# Patient Record
Sex: Female | Born: 1955
Health system: Southern US, Community
[De-identification: ages and names within clinical notes are randomized; demographics above are authoritative.]

## PROBLEM LIST (undated history)

## (undated) DIAGNOSIS — E079 Disorder of thyroid, unspecified: Secondary | ICD-10-CM

## (undated) DIAGNOSIS — Z923 Personal history of irradiation: Secondary | ICD-10-CM

## (undated) DIAGNOSIS — C50212 Malignant neoplasm of upper-inner quadrant of left female breast: Secondary | ICD-10-CM

## (undated) DIAGNOSIS — T8859XA Other complications of anesthesia, initial encounter: Secondary | ICD-10-CM

## (undated) DIAGNOSIS — G43909 Migraine, unspecified, not intractable, without status migrainosus: Secondary | ICD-10-CM

## (undated) DIAGNOSIS — C50919 Malignant neoplasm of unspecified site of unspecified female breast: Secondary | ICD-10-CM

## (undated) DIAGNOSIS — K219 Gastro-esophageal reflux disease without esophagitis: Secondary | ICD-10-CM

## (undated) HISTORY — DX: Disorder of thyroid, unspecified: E07.9

## (undated) HISTORY — DX: Gastro-esophageal reflux disease without esophagitis: K21.9

## (undated) HISTORY — PX: ABDOMINAL HYSTERECTOMY: SHX81

## (undated) HISTORY — DX: Migraine, unspecified, not intractable, without status migrainosus: G43.909

## (undated) HISTORY — DX: Malignant neoplasm of unspecified site of unspecified female breast: C50.919

## (undated) HISTORY — DX: Malignant neoplasm of upper-inner quadrant of left female breast: C50.212

---
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
## 2002-11-14 HISTORY — PX: BLADDER SUSPENSION: SHX72

## 2008-01-02 ENCOUNTER — Ambulatory Visit: Payer: Self-pay | Admitting: Otolaryngology

## 2009-11-14 HISTORY — PX: COLONOSCOPY: SHX174

## 2011-06-06 ENCOUNTER — Ambulatory Visit: Payer: Self-pay | Admitting: Otolaryngology

## 2014-11-14 HISTORY — PX: BREAST LUMPECTOMY: SHX2

## 2014-12-11 ENCOUNTER — Ambulatory Visit: Payer: Self-pay | Admitting: Otolaryngology

## 2014-12-15 DIAGNOSIS — C50212 Malignant neoplasm of upper-inner quadrant of left female breast: Secondary | ICD-10-CM

## 2014-12-15 HISTORY — DX: Malignant neoplasm of upper-inner quadrant of left female breast: C50.212

## 2014-12-15 HISTORY — PX: BREAST BIOPSY: SHX20

## 2014-12-22 ENCOUNTER — Ambulatory Visit: Payer: Self-pay | Admitting: Physician Assistant

## 2014-12-30 ENCOUNTER — Ambulatory Visit: Payer: Self-pay | Admitting: Oncology

## 2014-12-31 ENCOUNTER — Encounter: Payer: Self-pay | Admitting: General Surgery

## 2014-12-31 ENCOUNTER — Other Ambulatory Visit: Payer: No Typology Code available for payment source

## 2014-12-31 ENCOUNTER — Ambulatory Visit (INDEPENDENT_AMBULATORY_CARE_PROVIDER_SITE_OTHER): Payer: No Typology Code available for payment source | Admitting: General Surgery

## 2014-12-31 VITALS — BP 124/72 | HR 74 | Resp 12 | Wt 123.0 lb

## 2014-12-31 DIAGNOSIS — C50911 Malignant neoplasm of unspecified site of right female breast: Secondary | ICD-10-CM | POA: Diagnosis not present

## 2014-12-31 DIAGNOSIS — C50919 Malignant neoplasm of unspecified site of unspecified female breast: Secondary | ICD-10-CM | POA: Insufficient documentation

## 2014-12-31 NOTE — Progress Notes (Addendum)
Patient ID: Amanda Odom, female   DOB: [birth year], 60 y.o.   MRN: [id number]  Chief Complaint  Patient presents with  . Other    breast evaluation    HPI Amanda Odom is a 59 y.o. female who presents for a left breast mass. The patient's most recent mammogram was done on 12/12/14. The patient subsequently had additional imaging and underwent a biopsy performed on the left breast on 12/22/14. She denies feeling any lumps. She gets regular mammograms and does regular self breast checks.  No previous abnormal mammograms. No past biopsies. No history of trauma.\\ She has seen Amanda Odom at the [facility].  HPI  Past Medical History  Diagnosis Date  . GERD (gastroesophageal reflux disease)   . Thyroid disease     Abnormal FNA, thyroidectomy pending with Amanda Sheffield, MD  . Breast cancer of upper-inner quadrant of left female breast February 2016    T1c,N0; ER 90%, PR 50-90%, HER-2/neu not amplified by fish.    Past Surgical History  Procedure Laterality Date  . Abdominal hysterectomy    . Tonsillectomy  1962  . Bladder suspension  2004  . Colonoscopy  2011    History reviewed. No pertinent family history.  Social History History  Substance Use Topics  . Smoking status: Never Smoker   . Smokeless tobacco: Never Used  . Alcohol Use: No    No Known Allergies  Current Outpatient Prescriptions  Medication Sig Dispense Refill  . Ascorbic Acid (VITAMIN C) 1000 MG tablet Take 1,000 mg by mouth daily.    . Coenzyme Q10 (CO Q 10 PO) Take 1 capsule by mouth daily.    Marland Kitchen CORAL CALCIUM PO Take 2 capsules by mouth daily.    . Flaxseed, Linseed, (FLAXSEED OIL) 1000 MG CAPS Take 1 capsule by mouth daily.    . Lactobacillus (ACIDOPHILUS PO) Take 1 tablet by mouth daily.    Marland Kitchen levothyroxine (SYNTHROID, LEVOTHROID) 50 MCG tablet Take 50 mcg by mouth daily before breakfast.    . Misc Natural Products (CURCUMAX PRO PO) Take 1 tablet by mouth daily.    . Multiple Vitamin (MULTIVITAMIN)  tablet Take 1 tablet by mouth daily.     No current facility-administered medications for this visit.    Review of Systems Review of Systems  Constitutional: Negative.   Respiratory: Negative.   Cardiovascular: Negative.     Blood pressure 124/72, pulse 74, resp. rate 12, weight 123 lb (55.792 kg).  Physical Exam Physical Exam  Constitutional: She is oriented to person, place, and time. She appears well-developed and well-nourished.  Neck: Neck supple.  Cardiovascular: Normal rate, regular rhythm and normal heart sounds.   Pulmonary/Chest: Effort normal and breath sounds normal. Right breast exhibits no inverted nipple, no mass, no nipple discharge, no skin change and no tenderness. Left breast exhibits skin change and tenderness. Left breast exhibits no inverted nipple, no mass and no nipple discharge.  Bruising breast with mild tenderness lower outer left breast.  Lymphadenopathy:    She has no cervical adenopathy.    She has no axillary adenopathy.  Neurological: She is alert and oriented to person, place, and time.  Skin: Skin is warm and dry.    Data Reviewed Screening mammograms dated 11/27/2014 completed at [facility] were reviewed. A new asymmetrical area was identified in the lower outer quadrant of the left breast. Subsequent focal spot compression views and ultrasound showed a spiculated irregular mass measuring up to 1.3 cm in diameter on mammogram, 1.0  cm on ultrasound. [id number]  Ultrasound-guided biopsy completed 12/22/2014 showed evidence of invasive mammary carcinoma, grade 1. ER: 90%, PR 50-90%, HER-2/neu 2+, equivocal. Reflex to fish is negative for amplification.  PCP notes as well as laboratory studies completed on 11/13/2014 were reviewed. The laboratory studies showed normal electrolytes, creatinine 0.6, estimated GFR 97, mild elevation of serum Mallie within a 4.9, normal liver function studies, hemoglobin 14.9 with an MCV of 89, white blood cell count  7400 with normal differential.  Ultrasound examination of the left breast was completed to help determine whether nipple sparing can be undertaken. The mass measures 0.55 x 0.69 x 0.74 cm. This is approximately 0.6 cm below the skin and perhaps 1.5 cm from the nipple.  Assessment    Stage I carcinoma the left breast.  Thyroid nodule, possible follicular adenoma versus low-grade malignancy.    Plan    I spoke with her ENT, as the patient was very interested in having both her thyroidectomy in breast cancer surgery at the same time. This would likely push the envelope for surgical intervention up to 4 hours significantly raising her risk of complications. As the thyroid abnormality is a possible malignancy with low potential for progression, and her recent biopsy documents invasive mammary carcinoma,Dr,[name] and I both feel that the patient would be better served by having these procedures completed separately. Options for management were reviewed: 1) mastectomy with or without reconstruction versus 2) breast conservation followed by postoperative radiation therapy.  At this time the patient is interested in breast conservation. I think considering the small lesion and the ample DD cup size, this is very reasonable. The possibility that the nipple/areolar complex could not be preserved was reviewed. Sacrifice with late reconstruction is amenable to the patient.  We'll work at scheduling a convenient date for surgical intervention.      The patient reports appear nausea and vomiting with her last operative for procedure in 2004. A scopolamine patch was not utilized at that time to her recollection.    PCP:  Amanda Odom Ref. Amanda Odom Amanda Odom.  Amanda Odom 01/05/2015, 5:17 PM

## 2014-12-31 NOTE — Patient Instructions (Signed)
The patient is aware to call back for any questions or concerns.  

## 2015-01-05 ENCOUNTER — Encounter: Payer: Self-pay | Admitting: General Surgery

## 2015-01-05 ENCOUNTER — Other Ambulatory Visit: Payer: Self-pay | Admitting: General Surgery

## 2015-01-05 DIAGNOSIS — C50912 Malignant neoplasm of unspecified site of left female breast: Secondary | ICD-10-CM

## 2015-01-07 ENCOUNTER — Telehealth: Payer: Self-pay | Admitting: *Deleted

## 2015-01-07 ENCOUNTER — Other Ambulatory Visit: Payer: Self-pay | Admitting: *Deleted

## 2015-01-07 DIAGNOSIS — C50911 Malignant neoplasm of unspecified site of right female breast: Secondary | ICD-10-CM

## 2015-01-07 MED ORDER — SCOPOLAMINE 1 MG/3DAYS TD PT72: 1.0000 | MEDICATED_PATCH | TRANSDERMAL | Status: DC

## 2015-01-07 NOTE — Telephone Encounter (Signed)
Pt was advised that a prescription for a scopolamine patch was electronically sent to her [facility] in [place] by Rosann Auerbach RN and to use as directed. Advised her to call our office if any questions. She verbalized her understanding.

## 2015-01-12 ENCOUNTER — Ambulatory Visit: Payer: Self-pay | Admitting: General Surgery

## 2015-01-12 ENCOUNTER — Encounter: Payer: Self-pay | Admitting: General Surgery

## 2015-01-13 ENCOUNTER — Ambulatory Visit
Admit: 2015-01-13 | Disposition: A | Discharge status: Home / Self Care | Payer: Self-pay | Attending: Oncology | Admitting: Oncology

## 2015-01-13 ENCOUNTER — Encounter: Payer: Self-pay | Admitting: General Surgery

## 2015-01-13 HISTORY — PX: BREAST MAMMOSITE: SHX5264

## 2015-01-15 ENCOUNTER — Ambulatory Visit: Payer: Self-pay | Admitting: General Surgery

## 2015-01-15 ENCOUNTER — Encounter: Payer: Self-pay | Admitting: General Surgery

## 2015-01-15 DIAGNOSIS — C50919 Malignant neoplasm of unspecified site of unspecified female breast: Secondary | ICD-10-CM

## 2015-01-15 DIAGNOSIS — C50512 Malignant neoplasm of lower-outer quadrant of left female breast: Secondary | ICD-10-CM | POA: Diagnosis not present

## 2015-01-15 HISTORY — DX: Malignant neoplasm of unspecified site of unspecified female breast: C50.919

## 2015-01-15 HISTORY — PX: BREAST EXCISIONAL BIOPSY: SUR124

## 2015-01-15 HISTORY — PX: LYMPH NODE BIOPSY: SHX201

## 2015-01-15 HISTORY — PX: BREAST SURGERY: SHX581

## 2015-01-16 ENCOUNTER — Encounter: Payer: Self-pay | Admitting: General Surgery

## 2015-01-19 ENCOUNTER — Encounter: Payer: Self-pay | Admitting: General Surgery

## 2015-01-20 ENCOUNTER — Ambulatory Visit (INDEPENDENT_AMBULATORY_CARE_PROVIDER_SITE_OTHER): Payer: No Typology Code available for payment source | Admitting: General Surgery

## 2015-01-20 ENCOUNTER — Encounter: Payer: Self-pay | Admitting: General Surgery

## 2015-01-20 ENCOUNTER — Other Ambulatory Visit: Payer: No Typology Code available for payment source

## 2015-01-20 VITALS — BP 124/84 | HR 68 | Resp 12 | Ht 59.0 in | Wt 121.0 lb

## 2015-01-20 DIAGNOSIS — C50912 Malignant neoplasm of unspecified site of left female breast: Secondary | ICD-10-CM

## 2015-01-20 NOTE — Progress Notes (Signed)
Patient ID: Amanda Odom, female   DOB: 10-03-1956, 59 y.o.   MRN: [id number]  Chief Complaint  Patient presents with  . Routine Post Op    post op lymph node biopsy    HPI Amanda Odom is a 59 y.o. female who presents for a post op left breast wide excision and lymph node biopsy. The procedure was performed on 01/15/15. The patient is doing well. No new complaints at this time.   HPI  Past Medical History  Diagnosis Date  . GERD (gastroesophageal reflux disease)   . Thyroid disease     Abnormal FNA, thyroidectomy pending with [name], MD  . Breast cancer of upper-inner quadrant of left female breast February 2016    T1c,N0; ER 90%, PR 50-90%, HER-2/neu not amplified by fish.    Past Surgical History  Procedure Laterality Date  . Abdominal hysterectomy    . Tonsillectomy  1962  . Bladder suspension  2004  . Colonoscopy  2011  . Lymph node biopsy Left 01/15/15  . Breast surgery Left 01/15/15    wide excision    No family history on file.  Social History History  Substance Use Topics  . Smoking status: Never Smoker   . Smokeless tobacco: Never Used  . Alcohol Use: No    No Known Allergies  Current Outpatient Prescriptions  Medication Sig Dispense Refill  . Ascorbic Acid (VITAMIN C) 1000 MG tablet Take 1,000 mg by mouth daily.    . Coenzyme Q10 (CO Q 10 PO) Take 1 capsule by mouth daily.    Marland Kitchen CORAL CALCIUM PO Take 2 capsules by mouth daily.    . Flaxseed, Linseed, (FLAXSEED OIL) 1000 MG CAPS Take 1 capsule by mouth daily.    . Lactobacillus (ACIDOPHILUS PO) Take 1 tablet by mouth daily.    Marland Kitchen levothyroxine (SYNTHROID, LEVOTHROID) 50 MCG tablet Take 50 mcg by mouth daily before breakfast.    . Misc Natural Products (CURCUMAX PRO PO) Take 1 tablet by mouth daily.    . Multiple Vitamin (MULTIVITAMIN) tablet Take 1 tablet by mouth daily.    Marland Kitchen scopolamine (TRANSDERM-SCOP) 1 MG/3DAYS Place 1 patch (1.5 mg total) onto the skin every 3 (three) days. Apply evening prior to  surgery 2 patch 0   No current facility-administered medications for this visit.    Review of Systems Review of Systems  Constitutional: Negative.   Respiratory: Negative.   Cardiovascular: Negative.     Blood pressure 124/84, pulse 68, resp. rate 12, height '4\' 11"'  (1.499 m), weight 121 lb (54.885 kg).  Physical Exam Physical Exam  Constitutional: She appears well-developed and well-nourished.  Neck: No thyromegaly present.  Cardiovascular:  No murmur heard. Pulmonary/Chest:    Lymphadenopathy:    She has no cervical adenopathy.    Data Reviewed Ultrasound of the wide excision site was completed to determine if the patient would be a candidate for partial breast radiation. A post man mastoplasty cavity 1.8 x 3.8 x 4.0 was identified at a minimum distance of 1.8 cm from the skin.  Assessment    Stage I carcinoma of the left breast. Candidate for partial or whole breast radiation.    Plan    The patient's case was presented at the [facility] tumor board on 01/19/2015 and Oncotype DX testing recommended. This will be requested.    Patient is scheduled to see Dr Donella Stade at the [facility] on 01/22/15 at 10:00 am. Patient is aware of date and time.  PCP:  Rayann Heman 01/21/2015, 7:32 AM

## 2015-01-20 NOTE — Patient Instructions (Addendum)
Patient to be arranged to see Dr. Donella Stade. Heat can be used for comfort. The patient is aware to call back for any questions or concerns.  Patient is scheduled to see Dr Donella Stade at the [facility] on 01/22/15 at 10:00 am. Patient is aware of date and time.

## 2015-01-21 ENCOUNTER — Encounter: Payer: Self-pay | Admitting: *Deleted

## 2015-01-21 ENCOUNTER — Encounter: Payer: Self-pay | Admitting: General Surgery

## 2015-01-21 DIAGNOSIS — C50912 Malignant neoplasm of unspecified site of left female breast: Secondary | ICD-10-CM

## 2015-01-21 NOTE — Progress Notes (Signed)
Please request Oncotype DX testing from the lab. Thank you        Online Oncotype ordered and information faxed to insurance for authorization.

## 2015-01-22 ENCOUNTER — Telehealth: Payer: Self-pay | Admitting: *Deleted

## 2015-01-22 MED ORDER — CEFADROXIL 500 MG PO CAPS: 500.0000 mg | ORAL_CAPSULE | Freq: Two times a day (BID) | ORAL | Status: DC

## 2015-01-22 MED ORDER — FLUCONAZOLE 100 MG PO TABS: 100.0000 mg | ORAL_TABLET | Freq: Every day | ORAL | Status: AC

## 2015-01-22 NOTE — Telephone Encounter (Signed)
Mammosite schedule reviewed with the patient Placement 01-29-15     at [facility] Scan 02-02-15 Treat 02-03-15 to 02-09-15 Aware Abigail Butts will be calling her for more details Aware of ATB and directions reviewed. Aware no showers and to wear her bra while mammosite in place. Pt agrees.

## 2015-01-28 ENCOUNTER — Encounter: Payer: Self-pay | Admitting: General Surgery

## 2015-01-29 ENCOUNTER — Other Ambulatory Visit: Payer: No Typology Code available for payment source

## 2015-01-29 ENCOUNTER — Encounter: Payer: Self-pay | Admitting: General Surgery

## 2015-01-29 ENCOUNTER — Ambulatory Visit (INDEPENDENT_AMBULATORY_CARE_PROVIDER_SITE_OTHER): Payer: No Typology Code available for payment source | Admitting: General Surgery

## 2015-01-29 VITALS — BP 130/72 | HR 78 | Resp 12 | Ht 59.0 in | Wt 120.0 lb

## 2015-01-29 DIAGNOSIS — C50912 Malignant neoplasm of unspecified site of left female breast: Secondary | ICD-10-CM | POA: Diagnosis not present

## 2015-01-29 NOTE — Patient Instructions (Signed)
Patient care kit given to patient.  Instructed no showers, sponge bath while mammosite in place, take antibiotic. Follow up with [facility] as arranged. Discussed wearing your bra for support at all times. 

## 2015-01-29 NOTE — Progress Notes (Signed)
Patient ID: Amanda Odom, female   DOB: 01/25/1956, 59 y.o.   MRN: [id number]  Chief Complaint  Patient presents with  . Procedure    left breast mammosite    HPI Amanda Odom is a 59 y.o. female. The patient has been evaluated by radiation oncology and felt to be a candidate for accelerated partial breast radiation. Here today for left breast mammosite placement. The patient has initiated [facility] therapy as requested.  The procedure was reviewed with the patient and she was amenable to proceed. HPI  Past Medical History  Diagnosis Date  . GERD (gastroesophageal reflux disease)   . Thyroid disease     Abnormal FNA, thyroidectomy pending with [name], MD  . Breast cancer of upper-inner quadrant of left female breast February 2016    T1c,N0; ER 90%, PR 50-90%, HER-2/neu not amplified by fish.    Past Surgical History  Procedure Laterality Date  . Abdominal hysterectomy    . Tonsillectomy  1962  . Bladder suspension  2004  . Colonoscopy  2011  . Lymph node biopsy Left 01/15/15  . Breast surgery Left 01/15/15    Wide excision, mastoplasty, sentinel node biopsy.    No family history on file.  Social History History  Substance Use Topics  . Smoking status: Never Smoker   . Smokeless tobacco: Never Used  . Alcohol Use: No    No Known Allergies  Current Outpatient Prescriptions  Medication Sig Dispense Refill  . Ascorbic Acid (VITAMIN C) 1000 MG tablet Take 1,000 mg by mouth daily.    . cefadroxil (DURICEF) 500 MG capsule Take 1 capsule (500 mg total) by mouth 2 (two) times daily. Start one hour before office procedure 01-29-15 24 capsule 0  . Coenzyme Q10 (CO Q 10 PO) Take 1 capsule by mouth daily.    Marland Kitchen CORAL CALCIUM PO Take 2 capsules by mouth daily.    . Flaxseed, Linseed, (FLAXSEED OIL) 1000 MG CAPS Take 1 capsule by mouth daily.    . fluconazole (DIFLUCAN) 100 MG tablet Take 1 tablet (100 mg total) by mouth daily. Take one tablet on day one and then again on day  five 2 tablet 0  . Lactobacillus (ACIDOPHILUS PO) Take 1 tablet by mouth daily.    Marland Kitchen levothyroxine (SYNTHROID, LEVOTHROID) 50 MCG tablet Take 50 mcg by mouth daily before breakfast.    . Misc Natural Products (CURCUMAX PRO PO) Take 1 tablet by mouth daily.    . Multiple Vitamin (MULTIVITAMIN) tablet Take 1 tablet by mouth daily.    Marland Kitchen scopolamine (TRANSDERM-SCOP) 1 MG/3DAYS Place 1 patch (1.5 mg total) onto the skin every 3 (three) days. Apply evening prior to surgery 2 patch 0   No current facility-administered medications for this visit.    Review of Systems Review of Systems  Constitutional: Negative.   Respiratory: Negative.   Cardiovascular: Negative.     Blood pressure 130/72, pulse 78, resp. rate 12, height '4\' 11"'  (1.499 m), weight 120 lb (54.432 kg).  Physical Exam Physical Exam  Pulmonary/Chest:    Significant discoloration of the lower breast is noted consistent with ecchymosis/hematoma formation. There is no tenderness or erythema. The wound is intact except for a less than 2 mm area near the superior aspect overlying the edge of the areola which was resected at the time of surgery.    Data Reviewed Ultrasound examination showed a 1.5 cm fluid collection below the skin and then the deeper pocket which was chosen for balloon insertion.  The area was prepped with ChloraPrep Bethel with 10 mL of 0.5% Xylocaine with 0.25% Marcaine with 1-200,000 epinephrine was utilized well tolerated. ChloraPrep was again applied to the skin. The superficial fluid collection was aspirated with a 20-gauge needle and 10 mL of old hematoma retrieved. The depth of the fluid pocket decreased to 10 mm.  The deeper fluid pocket was cannulated with the 8 m trocar releasing about 30-40 mL of old hematoma. The cavity evaluation device was placed and inflated showing a few small areas of residual fluid but an excellent buffer of almost 2 cm to the overlying skin.  The treatment balloon was placed  without incident and inflated with 40 mL of saline to which 10 mL of Omnipaque had been added. The patient tolerated inflation without difficulty. The catheter exit site on the lateral aspect of the breast was treated with bacitracin ointment followed by dry gauze dressing and taped in place.  Assessment    Good placement of MammoSite balloon catheter for partial breast radiation.    Plan    The patient will be having her treatment simulation on Monday, March 21 an incomplete 5 days of twice a day therapy on Monday, March 28. We'll plan for a follow-up examination near the following week.  Dressing supplies were provided.     PCP:  Rayann Heman 01/29/2015, 9:39 PM

## 2015-02-02 ENCOUNTER — Encounter: Payer: Self-pay | Admitting: General Surgery

## 2015-02-02 ENCOUNTER — Ambulatory Visit (INDEPENDENT_AMBULATORY_CARE_PROVIDER_SITE_OTHER): Payer: No Typology Code available for payment source | Admitting: General Surgery

## 2015-02-02 VITALS — BP 124/68 | HR 74 | Resp 12 | Ht 59.0 in | Wt 128.0 lb

## 2015-02-02 DIAGNOSIS — C50912 Malignant neoplasm of unspecified site of left female breast: Secondary | ICD-10-CM

## 2015-02-02 DIAGNOSIS — T888XXA Other specified complications of surgical and medical care, not elsewhere classified, initial encounter: Secondary | ICD-10-CM

## 2015-02-02 NOTE — Progress Notes (Signed)
Patient ID: Amanda Odom, female   DOB: 11-Oct-1956, 59 y.o.   MRN: [id number]  Chief Complaint  Patient presents with  . Other    eval open incision near nipple    HPI Amanda Odom is a 59 y.o. female here today for a evaluation of an open incision site on left nipple.  HPI  Past Medical History  Diagnosis Date  . GERD (gastroesophageal reflux disease)   . Thyroid disease     Abnormal FNA, thyroidectomy pending with [name], MD  . Breast cancer of upper-inner quadrant of left female breast February 2016    T1c,N0; ER 90%, PR 50-90%, HER-2/neu not amplified by fish.    Past Surgical History  Procedure Laterality Date  . Abdominal hysterectomy    . Tonsillectomy  1962  . Bladder suspension  2004  . Colonoscopy  2011  . Lymph node biopsy Left 01/15/15  . Breast surgery Left 01/15/15    Wide excision, mastoplasty, sentinel node biopsy.    No family history on file.  Social History History  Substance Use Topics  . Smoking status: Never Smoker   . Smokeless tobacco: Never Used  . Alcohol Use: No    No Known Allergies  Current Outpatient Prescriptions  Medication Sig Dispense Refill  . Ascorbic Acid (VITAMIN C) 1000 MG tablet Take 1,000 mg by mouth daily.    . cefadroxil (DURICEF) 500 MG capsule Take 1 capsule (500 mg total) by mouth 2 (two) times daily. Start one hour before office procedure 01-29-15 24 capsule 0  . Coenzyme Q10 (CO Q 10 PO) Take 1 capsule by mouth daily.    Marland Kitchen CORAL CALCIUM PO Take 2 capsules by mouth daily.    . Flaxseed, Linseed, (FLAXSEED OIL) 1000 MG CAPS Take 1 capsule by mouth daily.    . Lactobacillus (ACIDOPHILUS PO) Take 1 tablet by mouth daily.    Marland Kitchen levothyroxine (SYNTHROID, LEVOTHROID) 50 MCG tablet Take 50 mcg by mouth daily before breakfast.    . Misc Natural Products (CURCUMAX PRO PO) Take 1 tablet by mouth daily.    . Multiple Vitamin (MULTIVITAMIN) tablet Take 1 tablet by mouth daily.    Marland Kitchen scopolamine (TRANSDERM-SCOP) 1 MG/3DAYS  Place 1 patch (1.5 mg total) onto the skin every 3 (three) days. Apply evening prior to surgery 2 patch 0   No current facility-administered medications for this visit.    Review of Systems Review of Systems  Constitutional: Negative.   Respiratory: Negative.   Cardiovascular: Negative.     Blood pressure 124/68, pulse 74, resp. rate 12, height '4\' 11"'  (1.499 m), weight 128 lb (58.06 kg).  Physical Exam Physical Exam Examination of the left breast wide excision site showed separation of the superior portion of the wound, including that involving the areola. The previously identified hematoma in the area noted at the time of MammoSite balloon placement has significantly drained.  Erythema or induration is noted. Data Reviewed CT scan completed earlier today for simulation for planned partial breast radiation chose good preservation of spacing between the balloon and the overlying skin.  Assessment    Wound hematoma, spontaneously drained.    Plan    It was elected to close the wound. The area was prepped with Betadine solution 3. 10 mL of 1% plain Xylocaine was utilized and carefully infiltrated into the wound to avoid damage to the underlying balloon.  The wound was closed with interrupted 4-0 nylon sutures. Dry dressing with Telfa and Tegaderm applied. Dry gauze  was applied to the MammoSite balloon exit site.  The procedure was well tolerated.  We'll plan for follow-up examination in 3 days.       [name] 02/03/2015, 8:21 PM

## 2015-02-03 ENCOUNTER — Ambulatory Visit: Payer: No Typology Code available for payment source | Admitting: General Surgery

## 2015-02-03 DIAGNOSIS — IMO0002 Reserved for concepts with insufficient information to code with codable children: Secondary | ICD-10-CM | POA: Insufficient documentation

## 2015-02-05 ENCOUNTER — Encounter: Payer: Self-pay | Admitting: General Surgery

## 2015-02-05 ENCOUNTER — Ambulatory Visit (INDEPENDENT_AMBULATORY_CARE_PROVIDER_SITE_OTHER): Payer: No Typology Code available for payment source | Admitting: General Surgery

## 2015-02-05 VITALS — BP 118/60 | HR 89 | Resp 14 | Ht 59.0 in | Wt 124.0 lb

## 2015-02-05 DIAGNOSIS — C50912 Malignant neoplasm of unspecified site of left female breast: Secondary | ICD-10-CM

## 2015-02-05 DIAGNOSIS — T888XXD Other specified complications of surgical and medical care, not elsewhere classified, subsequent encounter: Secondary | ICD-10-CM

## 2015-02-05 NOTE — Patient Instructions (Addendum)
Patient to return Wednesday.

## 2015-02-05 NOTE — Progress Notes (Signed)
Patient ID: Amanda Odom, female   DOB: 25-Jun-1956, 59 y.o.   MRN: [id number]  Chief Complaint  Patient presents with  . Follow-up    wound    HPI Amanda Odom is a 59 y.o. female.  Here today for follow up wound hematoma. She reports that the area is still draining. She reports the dressing has been changed once per day in the last two days.  HPI  Past Medical History  Diagnosis Date  . GERD (gastroesophageal reflux disease)   . Thyroid disease     Abnormal FNA, thyroidectomy pending with [name], MD  . Breast cancer of upper-inner quadrant of left female breast February 2016    T1c,N0; ER 90%, PR 50-90%, HER-2/neu not amplified by fish.    Past Surgical History  Procedure Laterality Date  . Abdominal hysterectomy    . Tonsillectomy  1962  . Bladder suspension  2004  . Colonoscopy  2011  . Lymph node biopsy Left 01/15/15  . Breast surgery Left 01/15/15    Wide excision, mastoplasty, sentinel node biopsy.    No family history on file.  Social History History  Substance Use Topics  . Smoking status: Never Smoker   . Smokeless tobacco: Never Used  . Alcohol Use: No    No Known Allergies  Current Outpatient Prescriptions  Medication Sig Dispense Refill  . Ascorbic Acid (VITAMIN C) 1000 MG tablet Take 1,000 mg by mouth daily.    . cefadroxil (DURICEF) 500 MG capsule Take 1 capsule (500 mg total) by mouth 2 (two) times daily. Start one hour before office procedure 01-29-15 24 capsule 0  . Coenzyme Q10 (CO Q 10 PO) Take 1 capsule by mouth daily.    Marland Kitchen CORAL CALCIUM PO Take 2 capsules by mouth daily.    . Flaxseed, Linseed, (FLAXSEED OIL) 1000 MG CAPS Take 1 capsule by mouth daily.    . Lactobacillus (ACIDOPHILUS PO) Take 1 tablet by mouth daily.    Marland Kitchen levothyroxine (SYNTHROID, LEVOTHROID) 50 MCG tablet Take 50 mcg by mouth daily before breakfast.    . Misc Natural Products (CURCUMAX PRO PO) Take 1 tablet by mouth daily.    . Multiple Vitamin (MULTIVITAMIN) tablet Take  1 tablet by mouth daily.     No current facility-administered medications for this visit.    Review of Systems Review of Systems  Constitutional: Negative.   Respiratory: Negative.   Cardiovascular: Negative.     Blood pressure 118/60, pulse 89, resp. rate 14, height '4\' 11"'  (1.499 m), weight 124 lb (56.246 kg).  Physical Exam Physical Exam  Constitutional: She is oriented to person, place, and time. She appears well-developed and well-nourished.  Pulmonary/Chest:    Neurological: She is alert and oriented to person, place, and time.       Assessment    Tolerating accelerated radiation well.  Superficial hematoma nearly completely drained.    Plan    I anticipate once the balloon is removed the area will heal without incident. We'll plan for follow-up examination in 6 days.     PCP:  Rayann Heman 02/06/2015, 6:35 AM

## 2015-02-11 ENCOUNTER — Ambulatory Visit (INDEPENDENT_AMBULATORY_CARE_PROVIDER_SITE_OTHER): Payer: No Typology Code available for payment source | Admitting: General Surgery

## 2015-02-11 ENCOUNTER — Encounter: Payer: Self-pay | Admitting: General Surgery

## 2015-02-11 VITALS — BP 110/68 | HR 70 | Resp 12 | Ht 59.0 in | Wt 120.0 lb

## 2015-02-11 DIAGNOSIS — C50912 Malignant neoplasm of unspecified site of left female breast: Secondary | ICD-10-CM

## 2015-02-11 DIAGNOSIS — T888XXA Other specified complications of surgical and medical care, not elsewhere classified, initial encounter: Secondary | ICD-10-CM

## 2015-02-11 NOTE — Patient Instructions (Signed)
Patient to return in one week. 

## 2015-02-11 NOTE — Progress Notes (Addendum)
Patient ID: Amanda Odom, female   DOB: Jan 16, 1956, 59 y.o.   MRN: [id number]  Chief Complaint  Patient presents with  . Follow-up    left breast     HPI Amanda Odom is a 59 y.o. female. Here today for her follow up mammosite placement done on 01/29/15 and was removed on 02/09/15. Patient was seen in the ER on 02/08/15 by Dr. Tamala Julian . Dr. Tamala Julian removed 15 cc of saline and put three sutures in below the nipple. Patient completed 8 of 10 planned treatments. The balloon was removed on March 28 by the radiation therapist. She reports feeling well, and not appreciating any odor from the surgical site.  HPI  Past Medical History  Diagnosis Date  . GERD (gastroesophageal reflux disease)   . Thyroid disease     Abnormal FNA, thyroidectomy pending with Amanda Sheffield, MD  . Breast cancer of upper-inner quadrant of left female breast February 2016    T1c,N0; ER 90%, PR 50-90%, HER-2/neu not amplified by fish.    Past Surgical History  Procedure Laterality Date  . Abdominal hysterectomy    . Tonsillectomy  1962  . Bladder suspension  2004  . Colonoscopy  2011  . Lymph node biopsy Left 01/15/15  . Breast surgery Left 01/15/15    Wide excision, mastoplasty, sentinel node biopsy.  . Breast mammosite  01/2015    No family history on file.  Social History History  Substance Use Topics  . Smoking status: Never Smoker   . Smokeless tobacco: Never Used  . Alcohol Use: No    No Known Allergies  Current Outpatient Prescriptions  Medication Sig Dispense Refill  . Ascorbic Acid (VITAMIN C) 1000 MG tablet Take 1,000 mg by mouth daily.    . cefadroxil (DURICEF) 500 MG capsule Take 1 capsule (500 mg total) by mouth 2 (two) times daily. Start one hour before office procedure 01-29-15 24 capsule 0  . Coenzyme Q10 (CO Q 10 PO) Take 1 capsule by mouth daily.    Marland Kitchen CORAL CALCIUM PO Take 2 capsules by mouth daily.    . Flaxseed, Linseed, (FLAXSEED OIL) 1000 MG CAPS Take 1 capsule by mouth daily.    .  Lactobacillus (ACIDOPHILUS PO) Take 1 tablet by mouth daily.    Marland Kitchen levothyroxine (SYNTHROID, LEVOTHROID) 50 MCG tablet Take 50 mcg by mouth daily before breakfast.    . Misc Natural Products (CURCUMAX PRO PO) Take 1 tablet by mouth daily.    . Multiple Vitamin (MULTIVITAMIN) tablet Take 1 tablet by mouth daily.     No current facility-administered medications for this visit.    Review of Systems Review of Systems  Constitutional: Negative.   Respiratory: Negative.   Cardiovascular: Negative.     Blood pressure 110/68, pulse 70, resp. rate 12, height '4\' 11"'  (1.499 m), weight 120 lb (54.432 kg).  Physical Exam Physical Exam  Constitutional: She is oriented to person, place, and time. She appears well-developed and well-nourished.  Pulmonary/Chest:    Neurological: She is alert and oriented to person, place, and time.  Skin: Skin is warm and dry.  Sutures removed. Culture obtained.  Data Reviewed The radiation dose was reviewed with Dr. Baruch Gouty. At this time there are no plans for external beam boost to the surgical site.  Assessment    Wound separation superiorly status post MammoSite balloon placement.    Plan    Patient to return in one week. The patient may shower. She'll be contacted when culture reports are available  if antibiotics are required.    PCP: Rayann Heman 02/12/2015, 11:18 AM   Cultures showed Serratia. Will treat with Bactrim DS one by mouth twice a day for a 2 week course.

## 2015-02-13 ENCOUNTER — Ambulatory Visit
Admit: 2015-02-13 | Disposition: A | Discharge status: Home / Self Care | Payer: Self-pay | Attending: Oncology | Admitting: Oncology

## 2015-02-16 ENCOUNTER — Telehealth: Payer: Self-pay

## 2015-02-16 LAB — ANAEROBIC AND AEROBIC CULTURE

## 2015-02-16 MED ORDER — SULFAMETHOXAZOLE-TRIMETHOPRIM 800-160 MG PO TABS: 1.0000 | ORAL_TABLET | Freq: Two times a day (BID) | ORAL | Status: DC

## 2015-02-16 NOTE — Telephone Encounter (Signed)
Notified patient as instructed, patient pleased. Discussed follow-up appointment for Thursday, patient agrees. She will pick up her prescription today.

## 2015-02-16 NOTE — Telephone Encounter (Signed)
-----   Message from [name], MD sent at 02/16/2015  3:32 PM EDT ----- Please notify the patient I sent a prescription for Bactrim DS to her pharmacy, one by mouth twice a day.  ----- Message -----    From: [facility] Lab Results In Interface    Sent: 02/16/2015   5:36 AM      To: [name], MD

## 2015-02-16 NOTE — Addendum Note (Signed)
Addended by: [name] on: 02/16/2015 03:32 PM   Modules accepted: Orders

## 2015-02-19 ENCOUNTER — Telehealth: Payer: Self-pay | Admitting: *Deleted

## 2015-02-19 ENCOUNTER — Ambulatory Visit: Payer: No Typology Code available for payment source | Admitting: General Surgery

## 2015-02-19 NOTE — Telephone Encounter (Signed)
Spoke with patient about rescheduling her follow up here. She is scheduled to follow up on 02/23/15 at 11:30 am. She says that she is still having vomiting and diarrhea. She is unable to keep any fluids down. She states that she still has a headache and no fever or chills.

## 2015-02-19 NOTE — Telephone Encounter (Signed)
She states she is having diarrhea and vomiting since last night. She started the ATB Monday night. No fever, chills but she does have a headache. Advised to stop ATB for 24hr it maybe a virus.

## 2015-02-20 ENCOUNTER — Telehealth: Payer: Self-pay | Admitting: General Surgery

## 2015-02-20 NOTE — Telephone Encounter (Signed)
Patient has made a good recovery from her 24 hour episode of vomiting and diarrhea. She'll restart her about tomorrow. Follow up on April 11 as scheduled.

## 2015-02-23 ENCOUNTER — Encounter: Payer: Self-pay | Admitting: General Surgery

## 2015-02-23 ENCOUNTER — Ambulatory Visit (INDEPENDENT_AMBULATORY_CARE_PROVIDER_SITE_OTHER): Payer: No Typology Code available for payment source | Admitting: General Surgery

## 2015-02-23 VITALS — BP 128/72 | HR 66 | Resp 12 | Ht 59.0 in | Wt 118.0 lb

## 2015-02-23 DIAGNOSIS — C50912 Malignant neoplasm of unspecified site of left female breast: Secondary | ICD-10-CM

## 2015-02-23 DIAGNOSIS — L0291 Cutaneous abscess, unspecified: Secondary | ICD-10-CM

## 2015-02-23 MED ORDER — CIPROFLOXACIN HCL 500 MG PO TABS: 500.0000 mg | ORAL_TABLET | Freq: Two times a day (BID) | ORAL | Status: AC

## 2015-02-23 NOTE — Progress Notes (Signed)
Patient ID: Amanda Odom, female   DOB: 1956-10-25, 59 y.o.   MRN: [id number]  Chief Complaint  Patient presents with  . Other    Follow up mammosite placement done 01/29/15    HPI Amanda Odom is a 59 y.o. female.  Patient here for follow up mammosite placement done on 01/29/15. Patient reports no new problems, draining less. Patient feeling much better since stopping the sulfamethoxzole. HPI  Past Medical History  Diagnosis Date  . GERD (gastroesophageal reflux disease)   . Thyroid disease     Abnormal FNA, thyroidectomy pending with [name], MD  . Breast cancer of upper-inner quadrant of left female breast February 2016    T1c,N0; ER 90%, PR 50-90%, HER-2/neu not amplified by fish.    Past Surgical History  Procedure Laterality Date  . Abdominal hysterectomy    . Tonsillectomy  1962  . Bladder suspension  2004  . Colonoscopy  2011  . Lymph node biopsy Left 01/15/15  . Breast surgery Left 01/15/15    Wide excision, mastoplasty, sentinel node biopsy.  . Breast mammosite  01/2015    No family history on file.  Social History History  Substance Use Topics  . Smoking status: Never Smoker   . Smokeless tobacco: Never Used  . Alcohol Use: No    Allergies  Allergen Reactions  . Sulfamethoxazole-Trimethoprim Diarrhea and Nausea And Vomiting    Current Outpatient Prescriptions  Medication Sig Dispense Refill  . Ascorbic Acid (VITAMIN C) 1000 MG tablet Take 1,000 mg by mouth daily.    . Coenzyme Q10 (CO Q 10 PO) Take 1 capsule by mouth daily.    Marland Kitchen CORAL CALCIUM PO Take 2 capsules by mouth daily.    . Flaxseed, Linseed, (FLAXSEED OIL) 1000 MG CAPS Take 1 capsule by mouth daily.    . Lactobacillus (ACIDOPHILUS PO) Take 1 tablet by mouth daily.    Marland Kitchen levothyroxine (SYNTHROID, LEVOTHROID) 50 MCG tablet Take 50 mcg by mouth daily before breakfast.    . Misc Natural Products (CURCUMAX PRO PO) Take 1 tablet by mouth daily.    . Multiple Vitamin (MULTIVITAMIN) tablet Take 1  tablet by mouth daily.    . ciprofloxacin (CIPRO) 500 MG tablet Take 1 tablet (500 mg total) by mouth 2 (two) times daily. 20 tablet 0   No current facility-administered medications for this visit.    Review of Systems Review of Systems  Constitutional: Negative.   Respiratory: Negative.   Cardiovascular: Negative.     Blood pressure 128/72, pulse 66, resp. rate 12, height '4\' 11"'  (1.499 m), weight 118 lb (53.524 kg).  Physical Exam Physical Exam  Constitutional: She is oriented to person, place, and time. She appears well-developed and well-nourished.  Eyes: Pupils are equal, round, and reactive to light.  Cardiovascular: Normal rate, regular rhythm and normal heart sounds.   Pulmonary/Chest: Effort normal and breath sounds normal.    Neurological: She is alert and oriented to person, place, and time.    Data Reviewed Previous culture showed Serratia sensitive to sulfa, Cipro and Levaquin.  Assessment    Good progress in healing at the wide excision site, no evidence of active deep infection.    Plan    The patient is on deck for thyroidectomy on April 25 with Lacie Draft, MD.  will place the patient on Cipro 500 mg by mouth twice a day for a ten-day course which she has tolerated well in the past. She'll continue local wound care with showers  and dry dressings. We'll plan to reassess the area in 10 days. I will be in touch with Dr. Ladene Artist to give him a status report on the patient's progress.   PCP:  Rayann Heman 02/24/2015, 4:25 PM

## 2015-02-24 DIAGNOSIS — L0291 Cutaneous abscess, unspecified: Secondary | ICD-10-CM | POA: Insufficient documentation

## 2015-03-05 ENCOUNTER — Ambulatory Visit
Admit: 2015-03-05 | Disposition: A | Discharge status: Home / Self Care | Payer: Self-pay | Attending: Otolaryngology | Admitting: Otolaryngology

## 2015-03-05 ENCOUNTER — Ambulatory Visit (INDEPENDENT_AMBULATORY_CARE_PROVIDER_SITE_OTHER): Payer: No Typology Code available for payment source | Admitting: General Surgery

## 2015-03-05 VITALS — BP 130/78 | HR 78 | Resp 12 | Ht 59.0 in | Wt 119.0 lb

## 2015-03-05 DIAGNOSIS — T888XXA Other specified complications of surgical and medical care, not elsewhere classified, initial encounter: Secondary | ICD-10-CM

## 2015-03-05 DIAGNOSIS — C50912 Malignant neoplasm of unspecified site of left female breast: Secondary | ICD-10-CM

## 2015-03-05 LAB — BASIC METABOLIC PANEL
Anion Gap: 4 — ABNORMAL LOW (ref 7–16)
BUN: 18 mg/dL
CREATININE: 0.65 mg/dL
Calcium, Total: 9.2 mg/dL
Chloride: 110 mmol/L
Co2: 26 mmol/L
EGFR (African American): >60
Glucose: 122 mg/dL — ABNORMAL HIGH
POTASSIUM: 3.6 mmol/L
Sodium: 140 mmol/L

## 2015-03-05 LAB — CBC
HCT: 41.4 % (ref 35.0–47.0)
HGB: 14.1 g/dL (ref 12.0–16.0)
MCH: 29.6 pg (ref 26.0–34.0)
MCHC: 34.1 g/dL (ref 32.0–36.0)
MCV: 87 fL (ref 80–100)
Platelet: 233 10*3/uL (ref 150–440)
RBC: 4.77 10*6/uL (ref 3.80–5.20)
RDW: 13.6 % (ref 11.5–14.5)
WBC: 7.3 10*3/uL (ref 3.6–11.0)

## 2015-03-05 NOTE — Progress Notes (Signed)
Patient ID: Amanda Odom, female   DOB: [birth year], 59 y.o.   MRN: [id number]  Chief Complaint  Patient presents with  . Routine Post Op    mammosite placement    HPI Amanda Odom is a 59 y.o. female here today following from her mammosite placement done on 01/29/15 and left breast hematoma drainage. Previous culture showed Serratia. She tolerated her antibiotic therapy well. HPI  Past Medical History  Diagnosis Date  . GERD (gastroesophageal reflux disease)   . Thyroid disease     Abnormal FNA, thyroidectomy pending with [name], MD  . Breast cancer of upper-inner quadrant of left female breast February 2016    T1c,N0; ER 90%, PR 50-90%, HER-2/neu not amplified by fish.  . Breast cancer   . Migraines     Past Surgical History  Procedure Laterality Date  . Abdominal hysterectomy    . Tonsillectomy  1962  . Bladder suspension  2004  . Colonoscopy  2011  . Lymph node biopsy Left 01/15/15  . Breast surgery Left 01/15/15    Wide excision, mastoplasty, sentinel node biopsy.  . Breast mammosite  01/2015    No family history on file.  Social History History  Substance Use Topics  . Smoking status: Never Smoker   . Smokeless tobacco: Never Used  . Alcohol Use: No    Allergies  Allergen Reactions  . Sulfamethoxazole-Trimethoprim Diarrhea and Nausea And Vomiting    Current Outpatient Prescriptions  Medication Sig Dispense Refill  . Ascorbic Acid (VITAMIN C) 1000 MG tablet Take 1,000 mg by mouth daily.    . ciprofloxacin (CIPRO) 500 MG tablet Take 1 tablet (500 mg total) by mouth 2 (two) times daily. 20 tablet 0  . Coenzyme Q10 (CO Q 10 PO) Take 1 capsule by mouth daily.    Marland Kitchen CORAL CALCIUM PO Take 2 capsules by mouth daily.    . Flaxseed, Linseed, (FLAXSEED OIL) 1000 MG CAPS Take 1 capsule by mouth daily.    . Lactobacillus (ACIDOPHILUS PO) Take 1 tablet by mouth daily.    Marland Kitchen levothyroxine (SYNTHROID, LEVOTHROID) 50 MCG tablet Take 50 mcg by mouth daily before  breakfast.    . Misc Natural Products (CURCUMAX PRO PO) Take 1 tablet by mouth daily.    . Multiple Vitamin (MULTIVITAMIN) tablet Take 1 tablet by mouth daily.     No current facility-administered medications for this visit.    Review of Systems Review of Systems  Constitutional: Negative.   Respiratory: Negative.   Cardiovascular: Negative.     Blood pressure 130/78, pulse 78, resp. rate 12, height _0  (1.499 m), weight 119 lb (53.978 kg).  Physical Exam Physical Exam  Constitutional: She appears well-developed and well-nourished.  Pulmonary/Chest:       Assessment    Slow resolution of abscess cavity status post wide excision, mastoplasty and partial breast radiation.    Plan    The patient will complete her present course of antibiotics. I have spoken personally with [name], M.D. her ENT physician who is planning to complete a thyroidectomy next week. He is aware the previous culture results. Considering her marked improvement on oral Cipro, there is no absolute contraindication to proceeding with thyroidectomy.  We'll plan for follow-up examination in 2 weeks.     PCP: Rayann Heman 03/05/2015, 9:39 PM

## 2015-03-05 NOTE — Patient Instructions (Signed)
Patient to return in two weeks  

## 2015-03-09 ENCOUNTER — Ambulatory Visit
Admit: 2015-03-09 | Disposition: A | Discharge status: Home / Self Care | Payer: Self-pay | Attending: Otolaryngology | Admitting: Otolaryngology

## 2015-03-09 HISTORY — PX: THYROIDECTOMY: SHX17

## 2015-03-09 LAB — SURGICAL PATHOLOGY

## 2015-03-10 LAB — CALCIUM: Calcium, Total: 8 mg/dL — ABNORMAL LOW

## 2015-03-12 LAB — SURGICAL PATHOLOGY

## 2015-03-15 NOTE — Op Note (Signed)
PATIENT NAME:  Amanda Odom, Amanda Odom MR#:  [id number] DATE OF BIRTH:  04-01-1956  DATE OF PROCEDURE:  01/15/2015  PREOPERATIVE DIAGNOSIS: Left breast cancer.   POSTOPERATIVE DIAGNOSIS: Left breast cancer.   OPERATIVE PROCEDURE: Left breast wide excision with mastoplasty, sentinel node biopsy.   SURGEON: [name], MD  ANESTHESIA: General by LMA under Dr. Ronelle Nigh; Marcaine 0.5% with 1:200,000 units of epinephrine, 30 mL local infiltration.   ESTIMATED BLOOD LOSS: Minimal.   CLINICAL NOTE: This 59 year old woman was recently noted with an abnormal mammogram and subsequently underwent an ultrasound-guided core biopsy showing evidence of invasive mammary carcinoma. She desired breast conservation.   OPERATIVE NOTE: With the patient under adequate general anesthesia, 3 mL of methylene blue diluted 1:2 with normal saline was injected in the subareolar plexus. She had previously been injected with technetium sulfur colloid by the radiology service. The breast was prepped with ChloraPrep and draped. Attention was turned to the axilla where areas of increased uptake were appreciated with the gamma finder.   A transverse incision was made after instillation of local anesthesia in the lower aspect of the axillary hairline. The skin was incised sharply and the remaining dissection carried out with electrocautery. A hot blue node as well as 2 additional hot, non-blue, nodes were identified and sent for examination. Frozen section reported no evidence of macro-metastatic disease. A fourth nonsentinel node was sent for routine histology. The wound was closed with interrupted 2-0 Vicryl figure-of-8 sutures to the fascial layer and then with a running 4-0 Vicryl subcuticular suture for the skin.   Attention was turned to the breast. Ultrasound was used to identify the area of the previous biopsy, which was at the edge of the areola at the 6 o'clock position. It was within 6 mm of the skin and it was elected to  excise a small ellipse of skin beginning at the base of the nipple and extending for approximately 5-6 cm this was incised sharply and then the remaining dissection completed with electrocautery. A 3 x 5 x 6 cm block of tissue was excised, orientated, and specimen radiograph confirmed the previously placed ribbon clip was present. Margins were reported as clear from Delorse Lek, MD, in the pathology department.   The breast parenchyma was elevated off the underlying pectoralis fascia and then approximated with interrupted 2-0 Vicryl figure-of-8 sutures. Similar procedures were used in additional layers throughout the breast parenchyma. Superficial flaps were then elevated for 3 cm circumferentially and the skin approximated with a running 3-0 Vicryl subcuticular suture.   Benzoin and Steri-Strips were applied to both wounds. A compressive dressing, taking care not to impress the nipple area, was applied.   The patient tolerated the procedure well and was taken to the recovery room in stable condition.    ____________________________ [name], MD jwb:ah D: 01/15/2015 22:03:01 ET T: 01/16/2015 09:06:16 ET JOB#: [id number]  cc: [name], MD, <Dictator> Kathlene November. Grayland Ormond, MD [name], PA Neftali Thurow Amedeo Kinsman MD ELECTRONICALLY SIGNED 01/17/2015 7:41

## 2015-03-15 NOTE — Consult Note (Signed)
Reason for Visit: This 59 year old Female patient presents to the clinic for initial evaluation of  breast cancer .   Referred by Dr. Bary Castilla.  Diagnosis:  Chief Complaint/Diagnosis   38-year-old female with stage I ER/PR positive HER-2/neu not overexpressed invasive mammary carcinoma left breast as does wide local excision and sentinel node biopsy Oncotype DX pending  Pathology Report pathology report reviewed   Imaging Report mammograms and ultrasound reviewed   Referral Report clinical notes reviewed   Planned Treatment Regimen accelerated partial breast irradiationversus whole breast radiation   HPI   patient is a 59 year old female who presented on routine screeningmammograms have an abnormality in the left breast in the upper inner quadrant. She would ultrasound-guided guided biopsy which was positive for invasive mammary carcinoma. She also had a thyroid nodule had a fine needle aspiration of that which was suspicious although not definitive. She went on to have a wide local excision for a 1.1 cm invasive mammary carcinoma margins clear but close at less than 1 mm. 3 sentinel lymph nodes were negative tumor was grade 1. DCIS was present march up for that was also clear at 3 mm. Tumor is ER/PR positive HER-2/neu 2+. Oncotype DX has been ordered. She is doing well since her surgery although her left breast is somewhat swollen. She is seen today to discuss radiation therapy. Oncotype DX is pending.  Past Hx:    GERD - Esophageal Reflux:    Anemia:    Hypothyroidism:    Breast Cancer:    Migraines:    hypothyroidism:    Breast Biopsy:    Bladder Sling:    Thyroid Biopsy:    hysterectomy:   Past, Family and Social History:  Past Medical History positive   Gastrointestinal GERD   Endocrine hypothyroidism   Neurological/Psychiatric migraine   Past Surgical History hysterectomy, thyroid biopsy, bladder sling   Past Medical History Comments anemia   Family  History positive   Family History Comments father with Hodgkin's lymphoma   Social History noncontributory   Additional Past Medical and Surgical History seen by herself today   Allergies:   No Known Allergies:   Home Meds:  Home Medications: Medication Instructions Status  Synthroid 50 mcg (0.05 mg) oral tablet 1 tab(s) orally once a day Active  multivitamin 1 dose(s) orally once a day Active  Flax Seed Oil - oral capsule 1000 milligram(s) orally once a day Active  Co Q-10 100 mg oral capsule 2 cap(s) orally once a day Active  Probiotic Formula - oral capsule 1 cap(s) orally once a day Active  Vitamin C 1000 mg oral tablet 1 tab(s) orally once a day Active  calcium carbonate 500 mg oral tablet, chewable 1 tab(s) orally once a day Active  curcumin 650   once a day Active  ranitidine 150 mg oral tablet 1 tab(s) orally 2 times a day, As Needed Active   Review of Systems:  General negative   Performance Status (ECOG) 0   Skin negative   Breast see HPI   Ophthalmologic negative   ENMT negative   Respiratory and Thorax negative   Cardiovascular negative   Gastrointestinal negative   Genitourinary negative   Musculoskeletal negative   Neurological negative   Psychiatric negative   Hematology/Lymphatics negative   Endocrine negative   Allergic/Immunologic negative   Review of Systems   except for as breast abovedenies any weight loss, fatigue, weakness, fever, chills or night sweats. Patient denies any loss of vision, blurred vision.  Patient denies any ringing  of the ears or hearing loss. No irregular heartbeat. Patient denies heart murmur or history of fainting. Patient denies any chest pain or pain radiating to her upper extremities. Patient denies any shortness of breath, difficulty breathing at night, cough or hemoptysis. Patient denies any swelling in the lower legs. Patient denies any nausea vomiting, vomiting of blood, or coffee ground material in the  vomitus. Patient denies any stomach pain. Patient states has had normal bowel movements no significant constipation or diarrhea. Patient denies any dysuria, hematuria or significant nocturia. Patient denies any problems walking, swelling in the joints or loss of balance. Patient denies any skin changes, loss of hair or loss of weight. Patient denies any excessive worrying or anxiety or significant depression. Patient denies any problems with insomnia. Patient denies excessive thirst, polyuria, polydipsia. Patient denies any swollen glands, patient denies easy bruising or easy bleeding. Patient denies any recent infections, allergies or URI. Patient "s visual fields have not changed significantly in recent time.   Nursing Notes:  Nursing Vital Signs and Chemo Nursing Nursing Notes: *CC Vital Signs Flowsheet:   10-Mar-16 10:13  Temp Temperature 96.5  Pulse Pulse 69  Respirations Respirations 18  SBP SBP 142  DBP DBP 76  Pain Scale (0-10)  0  Current Weight (kg) (kg) 55.5  Height (cm) centimeters 149  BSA (m2) 1.4   Physical Exam:  General/Skin/HEENT:  General normal   Skin normal   Eyes normal   ENMT normal   Head and Neck normal   Additional PE a well-developed female in NAD. Lungs are clear to A&P cardiac examination shows regular rate and rhythm. Left breast is wide local excision scar which is healing well.  Breast is somewhat swollen and tense. Right breast is free of dominant mass or nodulin 2 positions examined. No axillary or supraclavicular adenopathy is appreciated. Abdomen is benign.   Breasts/Resp/CV/GI/GU:  Respiratory and Thorax normal   Cardiovascular normal   Gastrointestinal normal   Genitourinary normal   MS/Neuro/Psych/Lymph:  Musculoskeletal normal   Neurological normal   Psychiatric normal   Lymphatics normal   Relevent Results:   Relevant Scans and Labs mammograms were reviewed at our breast, cancer conference   Assessment and  Plan: Impression:   stage I ER/PR positive invasive mammary carcinoma left breast as post wide local excision sentinel biopsy in 79-year-old female for consideration of accelerator partial breast radiation Plan:   patient's case was presented her weekly tumor conference surgeon believes he may be able to place MammoSite balloon catheter and treat with accelerated partial breast radiation. I believe she would be a good candidate for this. If balloon cannot be place would recommend whole breast radiation. I have gone over risks and benefits of both treatments. Side effects including skin reaction, fatigue, inclusion of some superficial lung, alteration of blood counts, all were discussed in detail with the patient. We've arranged to have surgeon placed in MammoSite balloon catheter next week and follow-up CT treatment planning with [facility]. If balloon Placed again will go to whole breast radiation. If we need to go to whole breast radiation will await Oncotype DX since we would sequence are radiation after systemic chemotherapy if necessary. Patient will also be candidate for aromatase inhibitor therapy after completion of all radiation and treatment.  I would like to take this opportunity for allowing me to participate in the care of your patient..  Fax to Physician:  Physicians To Recieve Fax: [name], MD - [phone] Bary Castilla,  Forest Gleason - [phone].  Electronic Signatures: Mireya Meditz, Roda Shutters (MD)  (Signed 10-Mar-16 11:35)  Authored: HPI, Diagnosis, Past Hx, PFSH, Allergies, Home Meds, ROS, Nursing Notes, Physical Exam, Relevent Results, Encounter Assessment and Plan, Fax to Physician   Last Updated: 10-Mar-16 11:35 by Armstead Peaks (MD)

## 2015-03-15 NOTE — Op Note (Addendum)
PATIENT NAME:  Amanda Odom, Amanda Odom MR#:  [id number] DATE OF BIRTH:  1956-04-11  DATE OF PROCEDURE:  03/09/2015  PREOPERATIVE DIAGNOSIS:  Right thyroid nodule with follicular neoplasm, suspicious of cancer.   POSTOPERATIVE DIAGNOSIS: Right thyroid nodule with follicular neoplasm, suspicious of cancer.   OPERATION PERFORMED: Total thyroidectomy.   ANESTHESIA: General.   COMPLICATIONS: None.   TOTAL ESTIMATED BLOOD LOSS: 25 mL.   SURGEON: Huey Romans, MD  ASSISTANT: Jerene Bears, MD   DESCRIPTION OF PROCEDURE: The patient was brought to the operating suite and was placed in the supine position on the operating table. General anesthesia was performed using oral endotracheal tube with electrodes wrapped around it to continuously monitor the vocal cords and recurrent laryngeal nerves. This was done under direct vision with the GlideScope. The patient had a small shoulder roll placed, and the neck was extended slightly for visualization  of the low neck. The mass was easily palpable to the right side of midline. Skin crease marking was placed about a fingerbreadth above the sternal notch. Once this was marked, the skin was then infiltrated with 5 mL of 1% Xylocaine with epinephrine 1:100,000. She was prepped and draped in a sterile fashion.   A skin incision was created and carried down through skin and subcutaneous and through the platysmal layer. Bleeding was controlled with electrocautery. Once the platysma was opened, the dural hooks were used for holding the skin edges back. The strap muscles were divided in the midline. The right thyroid mass was extending to the midline and pushing the strap muscles over slightly. These were elevated on both sides over the thyroid gland. The right side was addressed first because of the mass. Dissection was carried along its lateral border. Some of the inferior attachments were freed up, including the inferior vascular bundle. Laterally, there are lateral  veins that were cut with the Harmonic to free this up, and then the superior pole was dissected out. It was very thin and went very superior. Able to free it from the medial laryngeal structures and laterally from the carotid bundle. The superior thyroid vessels were cut across with the Harmonic, and the top of the gland was pulled down and freed up. This allowed the gland to rotate slightly. As it was rotated, the superior parathyroid gland was found and separated. This was right near where the recurrent laryngeal nerve was found. As this was found, more inferior attachments then were freed up from the gland, and the inferior parathyroid gland was found at this time and separated from the gland and placed down into the bed. This allowed the inferior portion of the gland to be free and rotate a little bit farther and free it from the pretracheal fascia. With the superior pole still attached at the [facility] ligament, dissection was carried along the recurrent laryngeal nerve to follow it up where it went into the larynx. The Berry ligament more medial to it was then freed up along with the vessels overlying this, until the entire right gland was now free. This left a small amount of isthmus in the midline, but the entire right gland was freed up. Recurrent laryngeal nerve was intact, and both parathyroid glands were intact.   The left side was then addressed with the strap muscles elevated over the gland itself. The inferior pole was freed up first, and then the medial, lateral venous attachments were freed up so that this could rotate some medially. The superior pole was then tracked out  because it was a low incision. Again, the pole was thin and tracked superiorly until the superior vessels were able to be crossclamped with the Harmonic and cut and coagulated. The superior pole was then freed up and laid down, and as the gland was rotated medially, the recurrent laryngeal nerve was found just underneath the superior  parathyroid. The parathyroid gland was left in the bed, and the nerve was tracked up to where it went into the laryngeal musculature. Again, the inferior attachments were freed up to separate the inferior gland from the trachea. The only attachments left were the [facility] ligament. These were cut across with the Harmonic scalpel to free it from the trachea. There was no pyramidal lobe noted. With the specimen removed, a marking stitch was placed on the superior left pole of the thyroid gland.   Both sides were revisualized and copiously irrigated. Both recurrent laryngeal nerves stimulated well. The superior and inferior parathyroid glands were found on both sides and were left in the bed intact, with still a reasonable blood supply. Some Surgicel was placed into the thyroid bed bilaterally, using one-half piece on each side. TLS drains were placed bilaterally using 7-French size. These were eventually placed to low continuous Vacutainer suction. The strap muscles were loosely brought together in the midline using 4-0 Vicryl and then the platysmal layer was closed using 4-0 Vicryl interrupted sutures. The shoulder roll was removed to allow the neck to come back to its more natural position. Once the strap muscles were sutured, the dermal layer was then sutured with buried 4-0 Vicryl sutures. The skin edges were then held in apposition with a running locking nylon suture of 6-0 nylon. The wound was then covered with a little bit of bacitracin followed by Telfa and median Tegaderm. The drains were placed to Vacutainer suction and were taped to prevent pulling them out.   The patient was awakened and taken to the recovery room in satisfactory condition. There were no operative complications.    ____________________________ Huey Romans, MD phj:je D: 03/09/2015 18:46:00 ET T: 03/10/2015 08:10:48 ET JOB#: [id number]  cc: Huey Romans, MD, <Dictator> Huey Romans MD ELECTRONICALLY SIGNED 03/13/2015 79:15

## 2015-03-15 NOTE — Op Note (Signed)
PATIENT NAME:  Amanda Odom, Amanda Odom MR#:  [id number] DATE OF BIRTH:  1956/11/07  DATE OF PROCEDURE:  02/07/2015  HISTORY OF PRESENT ILLNESS: This 59 year old female accompanied by her husband came in to the Emergency Room for evaluation of an open wound of the left breast.  She recently had a left partial mastectomy of the inferior aspect of the left breast and also a sentinel lymph node biopsy. It was further noted it was elected to treat her with MammoSite therapy and recently had insertion of a MammoSite catheter which entered the lower outer quadrant of the left breast.  It is further noted that the skin incision of the partial mastectomy site had dehisced approximately 5 days ago and was repaired in the office by Dr. Bary Castilla.   The patient called today to state that the wound was open just below the nipple, and the patient was able to see the balloon of the MammoSite catheter. She also reported some minimal amount of serosanguineous drainage.   PHYSICAL EXAMINATION:  The dressings were removed and noted the presence of the MammoSite catheter. The axillary wound appeared to be healing satisfactorily. The breast wound was partially dehisced.  It was oriented longitudinally with the end of the wound just below the nipple. The wound was approximately 1.4 x 1.4 cm in dimension and could see the MammoSite catheter beneath this.  It appeared that there was some tension on the skin. There were several sutures apparent, but these had pulled through.  OPERATIVE PROCEDURE:  The catheter was decompressed by removing a total of 15 mL from the balloon. The operative site was prepared with Betadine solution and draped in a sterile manner. The skin surrounding the dehiscence was infiltrated with 1% Xylocaine with epinephrine. There was no apparent purulent discharge, but just some serosanguineous drainage which was scant. The wound was repaired with 4-0 nylon simple sutures with a longitudinally oriented suture line. There  was a small amount of opening both at the cranial end and at the caudal which would allow for additional drainage. Dressings were applied using 4 x 4 cotton gauze and 2 inch paper tape.   DIAGNOSIS:  Left breast cancer with dehiscence of breast wound.   PROCEDURE: Closure of breast wound.   SURGEON: Rochel Brome, M.D.   ANESTHESIA: Local.   Instructions were given to keep the current dressing dry and intact.   She will return to the [facility] in 2 days to have the completion of her MammoSite therapy and have the catheter removed.    ____________________________ Lenna Sciara. Rochel Brome, MD jws:sp D: 02/07/2015 19:06:46 ET T: 02/08/2015 11:31:10 ET JOB#: [id number]  cc: Loreli Dollar, MD, <Dictator> [name], MD Armstead Peaks, MD  Loreli Dollar MD ELECTRONICALLY SIGNED 02/10/2015 17:23

## 2015-03-17 ENCOUNTER — Ambulatory Visit (INDEPENDENT_AMBULATORY_CARE_PROVIDER_SITE_OTHER): Payer: No Typology Code available for payment source | Admitting: General Surgery

## 2015-03-17 ENCOUNTER — Encounter: Payer: Self-pay | Admitting: General Surgery

## 2015-03-17 ENCOUNTER — Other Ambulatory Visit: Payer: Self-pay | Admitting: Oncology

## 2015-03-17 VITALS — BP 108/78 | HR 76 | Resp 14 | Ht 59.0 in | Wt 118.0 lb

## 2015-03-17 DIAGNOSIS — C50912 Malignant neoplasm of unspecified site of left female breast: Secondary | ICD-10-CM

## 2015-03-17 DIAGNOSIS — N951 Menopausal and female climacteric states: Secondary | ICD-10-CM

## 2015-03-17 MED ORDER — LETROZOLE 2.5 MG PO TABS: 2.5000 mg | ORAL_TABLET | Freq: Every day | ORAL | Status: DC

## 2015-03-17 NOTE — Progress Notes (Signed)
Patient ID: Amanda Odom, female   DOB: 1956/02/11, 59 y.o.   MRN: [id number]  Chief Complaint  Patient presents with  . Follow-up    Breast cancer    HPI Amanda Odom is a 58 y.o. female here today following up from left breast cancer with hematoma drainage. She states there is a minimal opening with drainage.  Patient states she is doing well. She is covering from her thyroidectomy(benign) and doing well.  Bone density testing was at least 10 years ago.  HPI  Past Medical History  Diagnosis Date  . GERD (gastroesophageal reflux disease)   . Thyroid disease     Abnormal FNA, thyroidectomy pending with [name], MD  . Breast cancer of upper-inner quadrant of left female breast February 2016    T1c,N0; ER 90%, PR 50-90%, HER-2/neu not amplified by fish.  . Breast cancer   . Migraines     Past Surgical History  Procedure Laterality Date  . Abdominal hysterectomy    . Tonsillectomy  1962  . Bladder suspension  2004  . Colonoscopy  2011  . Lymph node biopsy Left 01/15/15  . Breast surgery Left 01/15/15    Wide excision, mastoplasty, sentinel node biopsy.  . Breast mammosite  01/2015  . 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    No family history on file.  Social History History  Substance Use Topics  . Smoking status: Never Smoker   . Smokeless tobacco: Never Used  . Alcohol Use: No    Allergies  Allergen Reactions  . Sulfamethoxazole-Trimethoprim Diarrhea and Nausea And Vomiting    Current Outpatient Prescriptions  Medication Sig Dispense Refill  . Ascorbic Acid (VITAMIN C) 1000 MG tablet Take 1,000 mg by mouth daily.    . Coenzyme Q10 (CO Q 10 PO) Take 1 capsule by mouth daily.    Marland Kitchen CORAL CALCIUM PO Take 2 capsules by mouth daily.    . Flaxseed, Linseed, (FLAXSEED OIL) 1000 MG CAPS Take 1 capsule by mouth daily.    . Lactobacillus (ACIDOPHILUS PO) Take 1 tablet by mouth daily.    Marland Kitchen levothyroxine (SYNTHROID, LEVOTHROID) 50 MCG tablet Take  100 mcg by mouth daily before breakfast.     . Misc Natural Products (CURCUMAX PRO PO) Take 1 tablet by mouth daily.    . Multiple Vitamin (MULTIVITAMIN) tablet Take 1 tablet by mouth daily.    Marland Kitchen letrozole (FEMARA) 2.5 MG tablet Take 1 tablet (2.5 mg total) by mouth daily. 30 tablet 12   No current facility-administered medications for this visit.    Review of Systems Review of Systems  Constitutional: Negative.   Respiratory: Negative.   Cardiovascular: Negative.     Blood pressure 108/78, pulse 76, resp. rate 14, height _0  (1.499 m), weight 118 lb (53.524 kg).  Physical Exam Physical Exam  Constitutional: She is oriented to person, place, and time. She appears well-developed and well-nourished.  Pulmonary/Chest:    1 x 1.5 skin opening just below left nipple.  Neurological: She is alert and oriented to person, place, and time.  Skin: Skin is warm and dry.    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 Total thyroidectomy completed.  Assessment    Focal fat necrosis within the wide excision site, asymptomatic.    Plan    The patient will continue daily application of Betadine to the area after showers. As she remains asymptomatic will defer reexcision  at this time.  With the initiation of antiestrogen therapy a repeat bone density is indicated. Prior study greater than 10 years ago.  Need for adequate calcium intake reviewed, she reports she is indeed taking 1200 mg per day. Vitamin D included.  We'll follow-up in 1 month and review her tolerance of Femara.     Follow up one month.   PCP: [name] [name] Dr. Baruch Gouty Dr. Thea Gist, [place] Gleason 03/18/2015, 10:11 PM

## 2015-03-17 NOTE — Patient Instructions (Addendum)
Continue self breast exams. Call office for any new breast issues or concerns. Bone density testing

## 2015-03-18 ENCOUNTER — Encounter: Payer: Self-pay | Admitting: General Surgery

## 2015-03-19 ENCOUNTER — Ambulatory Visit
Admission: RE | Admit: 2015-03-19 | Discharge: 2015-03-19 | Disposition: A | Discharge status: Home / Self Care | Payer: No Typology Code available for payment source | Source: Ambulatory Visit | Attending: Oncology | Admitting: Oncology

## 2015-03-19 DIAGNOSIS — Z780 Asymptomatic menopausal state: Secondary | ICD-10-CM | POA: Diagnosis not present

## 2015-03-19 DIAGNOSIS — M810 Age-related osteoporosis without current pathological fracture: Secondary | ICD-10-CM | POA: Insufficient documentation

## 2015-03-19 DIAGNOSIS — N951 Menopausal and female climacteric states: Secondary | ICD-10-CM

## 2015-03-23 ENCOUNTER — Encounter: Payer: Self-pay | Admitting: Radiation Oncology

## 2015-03-23 ENCOUNTER — Ambulatory Visit: Payer: Self-pay | Admitting: Radiation Oncology

## 2015-03-23 ENCOUNTER — Ambulatory Visit
Admission: RE | Admit: 2015-03-23 | Discharge: 2015-03-23 | Disposition: A | Discharge status: Home / Self Care | Payer: No Typology Code available for payment source | Source: Ambulatory Visit | Attending: Radiation Oncology | Admitting: Radiation Oncology

## 2015-03-23 VITALS — BP 152/88 | HR 71 | Temp 96.5°F | Resp 20 | Wt 119.5 lb

## 2015-03-23 DIAGNOSIS — C50912 Malignant neoplasm of unspecified site of left female breast: Secondary | ICD-10-CM

## 2015-03-23 NOTE — Progress Notes (Signed)
Radiation Oncology Follow up Note  Name: Amanda Odom   Date:   03/23/2015 MRN:  [id number] DOB: [birth year]    This 59 y.o. female presents to the clinic today for follow up. Breast cancer  REFERRING PROVIDER: [name], PA-C  HPI: Patient is a 59 year old female now 1 month out having completed accelerated partial breast irradiation to her left breast for 1.1 cm invasive mammary carcinoma with clear margins negative sentinel node grade 1 disease ER/PR positive HER-2/neu 2+ not overexpressed. She had a MammoSite balloon placed underwent high dose rate remote afterloading. She is seen today in routine follow-up and is doing well. She still has a slight drainage from the catheter site although this is granulating in well. She's been started on aromatase inhibitor and tolerating that well without side effect. She specifically denies breast tenderness cough or bone pain..  COMPLICATIONS OF TREATMENT: none  FOLLOW UP COMPLIANCE: keeps appointments   PHYSICAL EXAM:  BP 152/88 mmHg  Pulse 71  Temp(Src) 96.5 F (35.8 C)  Resp 20  Wt 119 lb 7.8 oz (54.2 kg) Well-developed female in NAD.Lungs are clear to A&P cardiac examination essentially unremarkable with regular rate and rhythm. No dominant mass or nodularity is noted in either breast in 2 positions examined. Incision is well-healed. No axillary or supraclavicular adenopathy is appreciated. Cosmetic result is excellent. She does have a small area with a catheter was placed which is still bandaged. Well-developed well-nourished patient in NAD. HEENT reveals PERLA, EOMI, discs not visualized.  Oral cavity is clear. No oral mucosal lesions are identified. Neck is clear without evidence of cervical or supraclavicular adenopathy. Lungs are clear to A&P. Cardiac examination is essentially unremarkable with regular rate and rhythm without murmur rub or thrill. Abdomen is benign with no organomegaly or masses noted. Motor sensory and DTR levels are  equal and symmetric in the upper and lower extremities. Cranial nerves II through XII are grossly intact. Proprioception is intact. No peripheral adenopathy or edema is identified. No motor or sensory levels are noted. Crude visual fields are within normal range.   RADIOLOGY RESULTS: No radiology reports to review  PLAN: Present time she is doing well 1 month out I am please were overall progress. She continues on aromatase inhibitor without side effect. I have asked to see her back in 6 months for follow-up. She continues close follow-up care with surgeon.  I would like to take this opportunity for allowing me to participate in the care of your patient.Armstead Peaks., MD

## 2015-03-24 ENCOUNTER — Ambulatory Visit: Payer: Self-pay

## 2015-04-20 ENCOUNTER — Ambulatory Visit (INDEPENDENT_AMBULATORY_CARE_PROVIDER_SITE_OTHER): Payer: No Typology Code available for payment source | Admitting: General Surgery

## 2015-04-20 ENCOUNTER — Encounter: Payer: Self-pay | Admitting: General Surgery

## 2015-04-20 VITALS — BP 128/72 | HR 66 | Resp 12 | Ht 59.0 in | Wt 119.0 lb

## 2015-04-20 DIAGNOSIS — C50912 Malignant neoplasm of unspecified site of left female breast: Secondary | ICD-10-CM

## 2015-04-20 NOTE — Progress Notes (Signed)
Patient ID: Amanda Odom, female   DOB: [birth year], 59 y.o.   MRN: [id number]  Chief Complaint  Patient presents with  . Follow-up    Breast Cancer    HPI Amanda Odom is a 59 y.o. female here today for follow up breast cancer. Patient reports minimal drainage with opening in left breast. She discontinued Femara on 04/14/15 did experience dizziness, vomiting, diarrhea occasional leg pain and headaches.  Since she stopped taking it she is feeling better.  Patient doing well since her thyroidectomy. HPI Past Medical History  Diagnosis Date  . GERD (gastroesophageal reflux disease)   . Thyroid disease     Abnormal FNA, thyroidectomy pending with [name], MD  . Breast cancer of upper-inner quadrant of left female breast February 2016    T1c,N0; ER 90%, PR 50-90%, HER-2/neu not amplified by fish.  . Breast cancer   . Migraines     Past Surgical History  Procedure Laterality Date  . Abdominal hysterectomy    . Tonsillectomy  1962  . Bladder suspension  2004  . Colonoscopy  2011  . Lymph node biopsy Left 01/15/15  . Breast surgery Left 01/15/15    Wide excision, mastoplasty, sentinel node biopsy.  . Breast mammosite  01/2015  . 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    No family history on file.  Social History History  Substance Use Topics  . Smoking status: Never Smoker   . Smokeless tobacco: Never Used  . Alcohol Use: No    Allergies  Allergen Reactions  . Sulfamethoxazole-Trimethoprim Diarrhea and Nausea And Vomiting    Current Outpatient Prescriptions  Medication Sig Dispense Refill  . Ascorbic Acid (VITAMIN C) 1000 MG tablet Take 1,000 mg by mouth daily.    . Coenzyme Q10 (CO Q 10 PO) Take 1 capsule by mouth daily.    Marland Kitchen CORAL CALCIUM PO Take 2 capsules by mouth daily.    . Flaxseed, Linseed, (FLAXSEED OIL) 1000 MG CAPS Take 1 capsule by mouth daily.    . Lactobacillus (ACIDOPHILUS PO) Take 1 tablet by mouth daily.    Marland Kitchen levothyroxine  (SYNTHROID, LEVOTHROID) 50 MCG tablet Take 100 mcg by mouth daily before breakfast.     . Misc Natural Products (CURCUMAX PRO PO) Take 1 tablet by mouth daily.    . Multiple Vitamin (MULTIVITAMIN) tablet Take 1 tablet by mouth daily.     No current facility-administered medications for this visit.    Review of Systems Review of Systems  Constitutional: Negative.   Respiratory: Negative.   Cardiovascular: Negative.     Blood pressure 128/72, pulse 66, resp. rate 12, height _0  (1.499 m), weight 119 lb (53.978 kg).  Physical Exam Physical Exam  Constitutional: She is oriented to person, place, and time. She appears well-developed and well-nourished.  Pulmonary/Chest:    Neurological: She is alert and oriented to person, place, and time.  Skin: Skin is warm and dry.    Data Reviewed Oncotype DX test results reviewed, low risk of recurrence based on 5 years of antiestrogen therapy discussed with the patient.   Assessment    Slow healing of the left wide excision site wound, no evidence of active infection. No indication for surgical intervention at this time.    Plan    The patient completed 8 of 10 planned radiation treatments. She has poorly tolerated Femara, first-line choice for postmenopausal breast cancer patients that are hormone sensitive. She is reluctant to consider additional  trials at this time, and is still working to get her thyroid hormone regulated.  We'll readdress the issue of additional trials, likely tamoxifen prior to another aromatase inhibitor.  The patient is scheduled for routine follow-up with [name] next month. She's been encouraged to express her concerns regarding antiestrogen therapy at that time to see if he has any additional thoughts regarding a treatment that might be well tolerated.     Plan to return in 3 months to try a different horomone medication. Plan to talk to [name] about options. If anything changes with opening will  call and let us know.  PCP: Rayann Heman 04/21/2015, 9:25 AM

## 2015-05-15 ENCOUNTER — Encounter: Payer: Self-pay | Admitting: *Deleted

## 2015-05-19 ENCOUNTER — Inpatient Hospital Stay: Payer: No Typology Code available for payment source | Attending: Oncology | Admitting: Oncology

## 2015-05-19 VITALS — BP 134/84 | HR 78 | Temp 96.1°F | Resp 18 | Wt 120.8 lb

## 2015-05-19 DIAGNOSIS — M818 Other osteoporosis without current pathological fracture: Secondary | ICD-10-CM | POA: Insufficient documentation

## 2015-05-19 DIAGNOSIS — Z90710 Acquired absence of both cervix and uterus: Secondary | ICD-10-CM | POA: Insufficient documentation

## 2015-05-19 DIAGNOSIS — E079 Disorder of thyroid, unspecified: Secondary | ICD-10-CM | POA: Insufficient documentation

## 2015-05-19 DIAGNOSIS — Z170 Estrogen receptor positive status [ER+]: Secondary | ICD-10-CM | POA: Diagnosis not present

## 2015-05-19 DIAGNOSIS — C50912 Malignant neoplasm of unspecified site of left female breast: Secondary | ICD-10-CM

## 2015-05-19 DIAGNOSIS — K219 Gastro-esophageal reflux disease without esophagitis: Secondary | ICD-10-CM | POA: Insufficient documentation

## 2015-05-19 DIAGNOSIS — C50212 Malignant neoplasm of upper-inner quadrant of left female breast: Secondary | ICD-10-CM | POA: Insufficient documentation

## 2015-05-19 DIAGNOSIS — Z79810 Long term (current) use of selective estrogen receptor modulators (SERMs): Secondary | ICD-10-CM | POA: Diagnosis not present

## 2015-05-19 DIAGNOSIS — Z923 Personal history of irradiation: Secondary | ICD-10-CM | POA: Insufficient documentation

## 2015-05-19 MED ORDER — TAMOXIFEN CITRATE 20 MG PO TABS: 20.0000 mg | ORAL_TABLET | Freq: Every day | ORAL | Status: DC

## 2015-05-27 NOTE — Progress Notes (Signed)
[facility]  [phone] [phone]) [phone]  ID: Amanda Odom OB: [birth year]  MR#: [id number]  EZM#:[id number]  Patient Care Team: [name], PA-C as PCP - General (Physician Assistant) Lloyd Huger, MD as Consulting Physician (Oncology) [name], MD (General Surgery)  CHIEF COMPLAINT:  Chief Complaint  Patient presents with  . Follow-up    breast cancer    INTERVAL HISTORY: Patient returns to clinic today for routine 3 month evaluation. She could not tolerate letrozole and was switched to tamoxifen. Currently patient feels well and is asymptomatic. She has no neurologic complaints. She denies any recent fevers or illnesses. She has a good appetite and denies weight loss. She denies any dysphasia. She has no chest pain or shortness of breath. She denies any nausea, vomiting, constipation, or diarrhea. She has no urinary complaints. Patient offers no specific complaints today.  REVIEW OF SYSTEMS:   Review of Systems  Constitutional: Negative.   Musculoskeletal: Negative.     As per HPI. Otherwise, a complete review of systems is negatve.  PAST MEDICAL HISTORY: Past Medical History  Diagnosis Date  . GERD (gastroesophageal reflux disease)   . Thyroid disease     Abnormal FNA, thyroidectomy pending with [name], MD  . Breast cancer of upper-inner quadrant of left female breast February 2016    T1c,N0; ER 90%, PR 50-90%, HER-2/neu not amplified by fish.  . Breast cancer   . Migraines     PAST SURGICAL HISTORY: Past Surgical History  Procedure Laterality Date  . Abdominal hysterectomy    . Tonsillectomy  1962  . Bladder suspension  2004  . Colonoscopy  2011  . Lymph node biopsy Left 01/15/15  . Breast surgery Left 01/15/15    Wide excision, mastoplasty, sentinel node biopsy.  . Breast mammosite  01/2015  . 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    FAMILY HISTORY:  Father with non-Hodgkin's  lymphoma     ADVANCED DIRECTIVES:    HEALTH MAINTENANCE: History  Substance Use Topics  . Smoking status: Never Smoker   . Smokeless tobacco: Never Used  . Alcohol Use: No     Colonoscopy:  PAP:  Bone density:  Lipid panel:  Allergies  Allergen Reactions  . Sulfamethoxazole-Trimethoprim Diarrhea and Nausea And Vomiting    Current Outpatient Prescriptions  Medication Sig Dispense Refill  . Ascorbic Acid (VITAMIN C) 1000 MG tablet Take 1,000 mg by mouth daily.    . Coenzyme Q10 (CO Q 10 PO) Take 1 capsule by mouth daily.    Marland Kitchen CORAL CALCIUM PO Take 2 capsules by mouth daily.    . Flaxseed, Linseed, (FLAXSEED OIL) 1000 MG CAPS Take 1 capsule by mouth daily.    . Lactobacillus (ACIDOPHILUS PO) Take 1 tablet by mouth daily.    Marland Kitchen levothyroxine (SYNTHROID, LEVOTHROID) 50 MCG tablet Take 100 mcg by mouth daily before breakfast.     . Misc Natural Products (CURCUMAX PRO PO) Take 1 tablet by mouth daily.    . Multiple Vitamin (MULTIVITAMIN) tablet Take 1 tablet by mouth daily.    . tamoxifen (NOLVADEX) 20 MG tablet Take 1 tablet (20 mg total) by mouth daily. 30 tablet 11   No current facility-administered medications for this visit.    OBJECTIVE: Filed Vitals:   05/19/15 1127  BP: 134/84  Pulse: 78  Temp: 96.1 F (35.6 C)  Resp: 18     Body mass index is 24.39 kg/(m^2).    ECOG  FS:0 - Asymptomatic  General: Well-developed, well-nourished, no acute distress. Eyes: anicteric sclera. Breasts: Exam deferred today. Lungs: Clear to auscultation bilaterally. Heart: Regular rate and rhythm. No rubs, murmurs, or gallops. Abdomen: Soft, nontender, nondistended. No organomegaly noted, normoactive bowel sounds. Musculoskeletal: No edema, cyanosis, or clubbing. Neuro: Alert, answering all questions appropriately. Cranial nerves grossly intact. Skin: No rashes or petechiae noted. Psych: Normal affect.   LAB RESULTS:  Lab Results  Component Value Date   NA 140 03/05/2015   K  3.6 03/05/2015   CL 110 03/05/2015   CO2 26 03/05/2015   GLUCOSE 122* 03/05/2015   BUN 18 03/05/2015   CREATININE 0.65 03/05/2015   CALCIUM 8.0* 03/10/2015   GFRNONAA >60 03/05/2015   GFRAA >60 03/05/2015    Lab Results  Component Value Date   WBC 7.3 03/05/2015   HGB 14.1 03/05/2015   HCT 41.4 03/05/2015   MCV 87 03/05/2015   PLT 233 03/05/2015     STUDIES: No results found.  ASSESSMENT: Pathological stage Ia ER/PR positive adenocarcinoma the left breast.   PLAN:    1. Breast cancer: Patient is now completed XRT. She did not require Herceptin or chemotherapy given her low Oncotype score. Continue tamoxifen for 5 years completing in April 2021. Return to clinic in 3 months for routine evaluation. 2. Osteoporosis: Bone mineral density on Mar 19, 2015 revealed a T score of -2.8. Continue calcium and vitamin D and consider Fosamax in the near future. 3. Thyroid nodule: Thyroidectomy did not reveal invasive carcinoma, continue monitoring by ENT.   Patient expressed understanding and was in agreement with this plan. She also understands that She can call clinic at any time with any questions, concerns, or complaints.    Lloyd Huger, MD   05/27/2015 11:13 AM

## 2015-06-02 ENCOUNTER — Encounter: Payer: Self-pay | Admitting: Oncology

## 2015-06-22 IMAGING — US US BIOPSY BREAST CORE W/ IMAGING
1 series · 8 of 8 positions shown · non-contrast
Comparison: Previous exams.

CLINICAL DATA: Patient presents for ultrasound-guided core needle
biopsy of a 9 mm mass of irregular shape and borders at the 6
o'clock position of the left periareolar region.

EXAM:
ULTRASOUND CORE BIOPSY; MAMMOGRAPHIC OUTSIDE FILMS MAMMO

[Series 1: us biopsy breast core w/ imaging · 0.08mm/px · 8 of 8 slices shown]
[im 1/8]
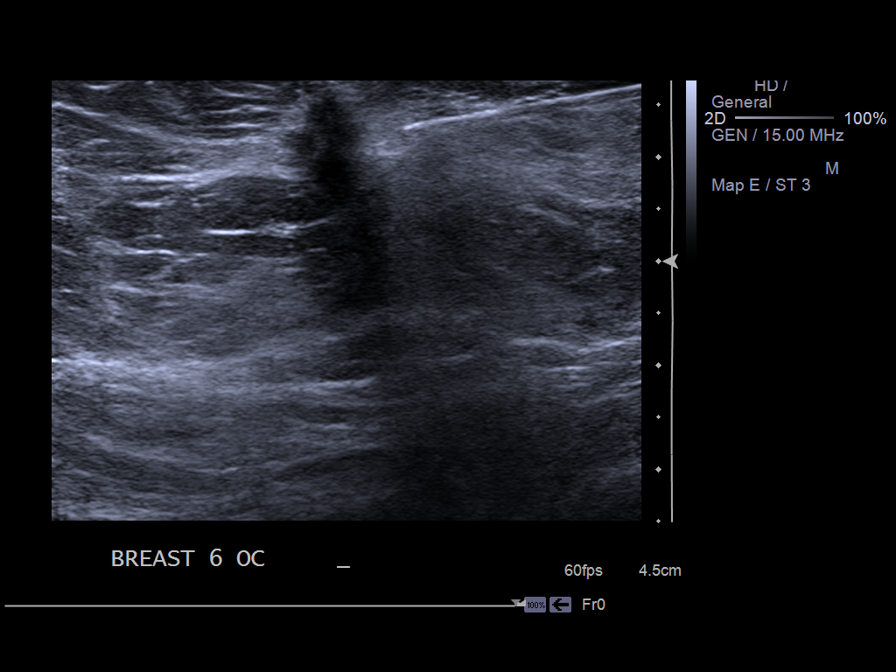
[im 2/8]
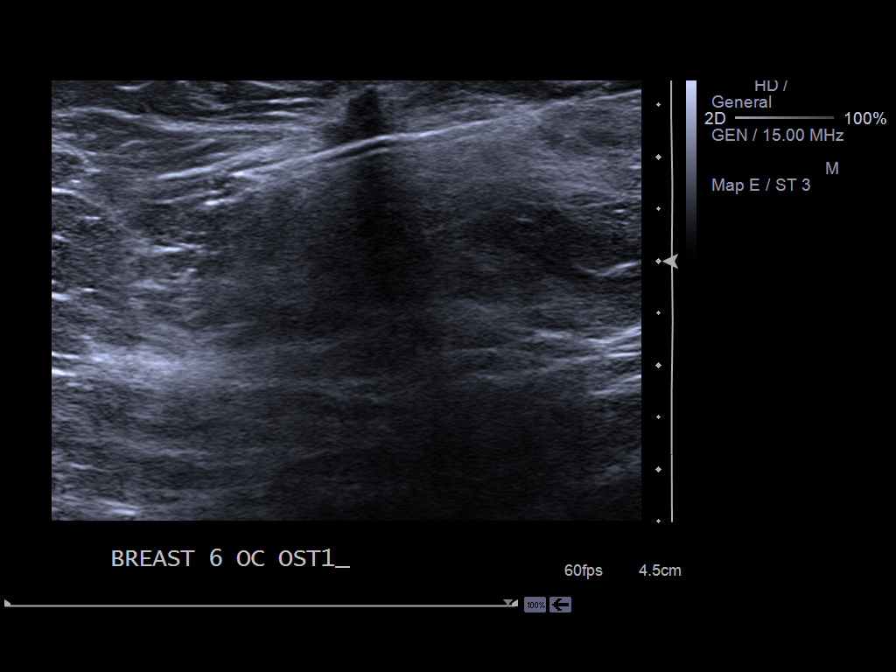
[im 3/8]
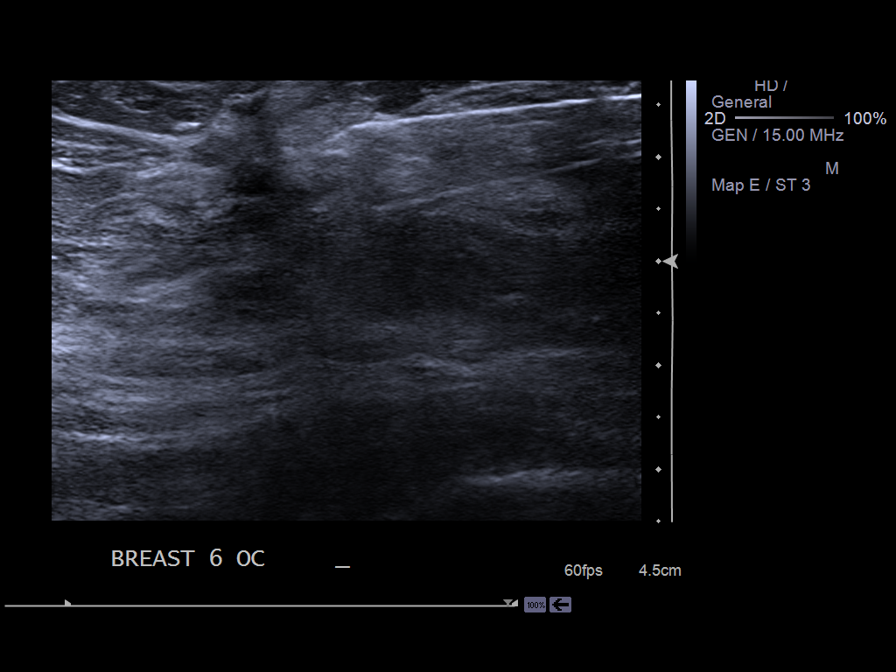
[im 4/8]
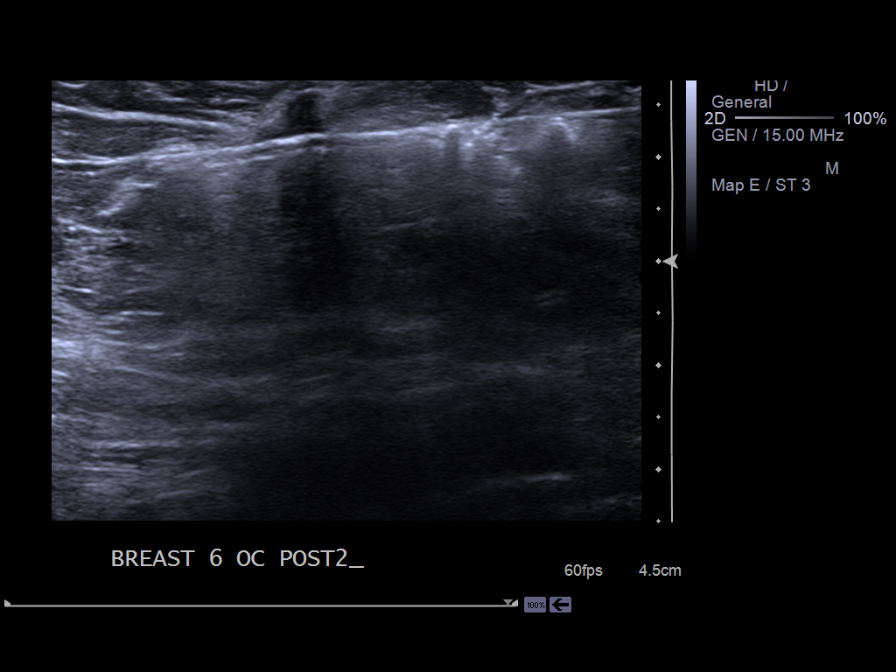
[im 5/8]
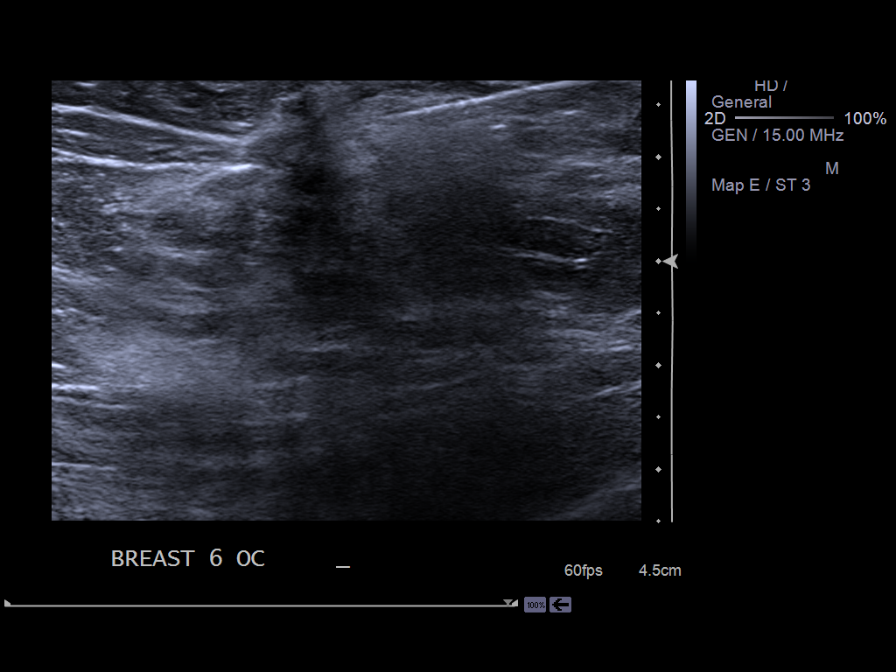
[im 6/8]
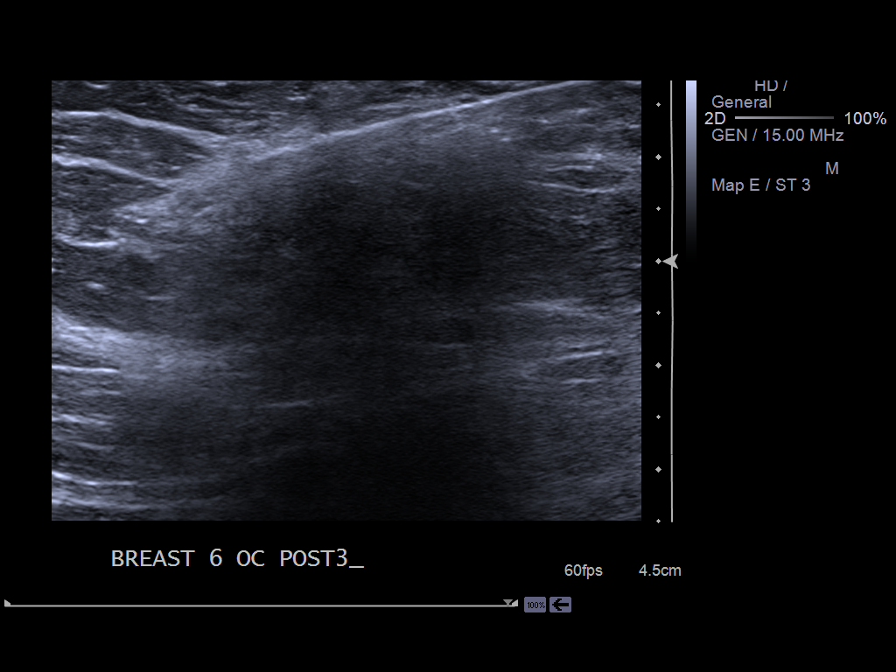
[im 7/8]
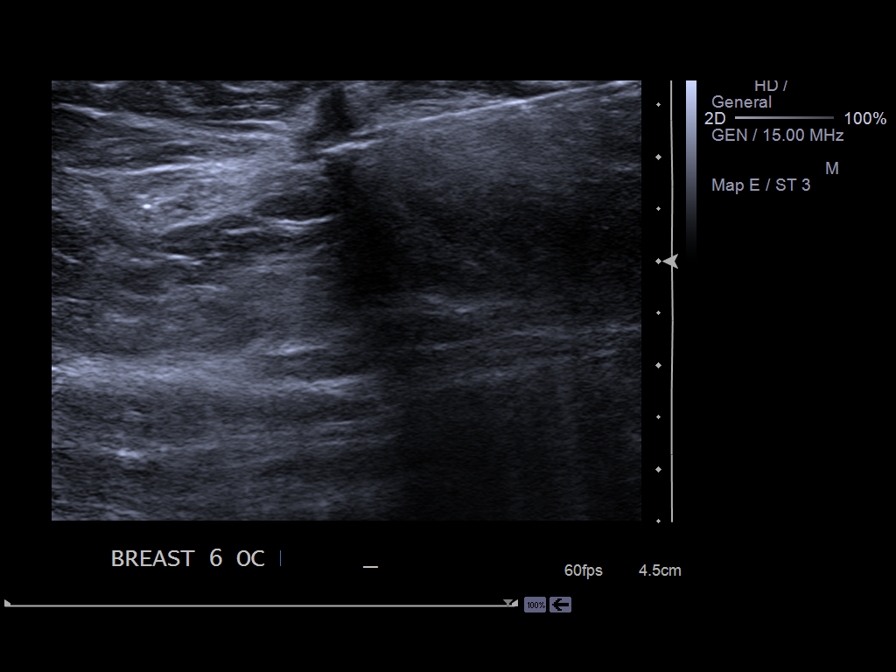
[im 8/8]
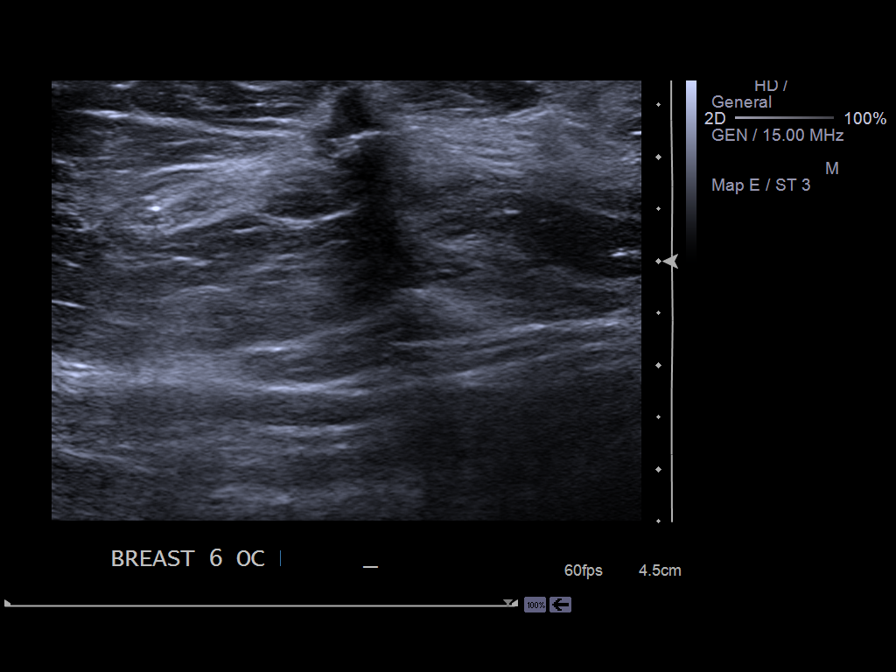

[8 of 8 positions shown; findings below may reference images not displayed]

PROCEDURE:
I met with the patient and we discussed the procedure of
ultrasound-guided biopsy, including benefits and alternatives. We
discussed the high likelihood of a successful procedure. We
discussed the risks of the procedure including infection, bleeding,
tissue injury, clip migration, and inadequate sampling. Informed
written consent was given.

Using sterile technique and 2% Lidocaine as local anesthetic, under
direct ultrasound visualization, a 14 gauge Vinogradova
biopsydevice was used to perform biopsy of the targeted mass at the
6 o'clock position using a lateral to medial approach. Three
adequate tissue specimens were obtained. At the conclusion of the
procedure, a ribbon shaped tissue marker clip was deployed into the
biopsy cavity. Follow-up 2-view mammogram was performed and dictated
separately.

The usual time-out protocol was performed immediately prior to the
procedure.
IMPRESSION: Ultrasound-guided biopsy of a suspicious left breast mass.. No
apparent complications.

## 2015-06-22 IMAGING — MG MM POST US BIOPSY *L*
2 series · 2 of 2 positions shown · non-contrast
Comparison: Previous exam(s).

ADDENDUM:
Addendum by Dr. Tiger on 12/26/2014 at [DATE] a.m.. I spoke with the
patient by telephone today to discuss her pathology results.
Pathology demonstrates invasive ductal carcinoma, which is
concordant with the imaging appearance. She reports mild soreness at
the biopsy site but no problems otherwise. Breast MRI is recommended
preoperatively. All questions were answered. The nurse navigator
will contact the patient regarding additional appointments and
referral.
CLINICAL DATA: Patient is post ultrasound-guided core needle biopsy
of a 9 mm irregular mass over the 6 o'clock position of the left
periareolar region.

EXAM:
DIAGNOSTIC LEFT MAMMOGRAM POST ULTRASOUND BIOPSY

[L ML]
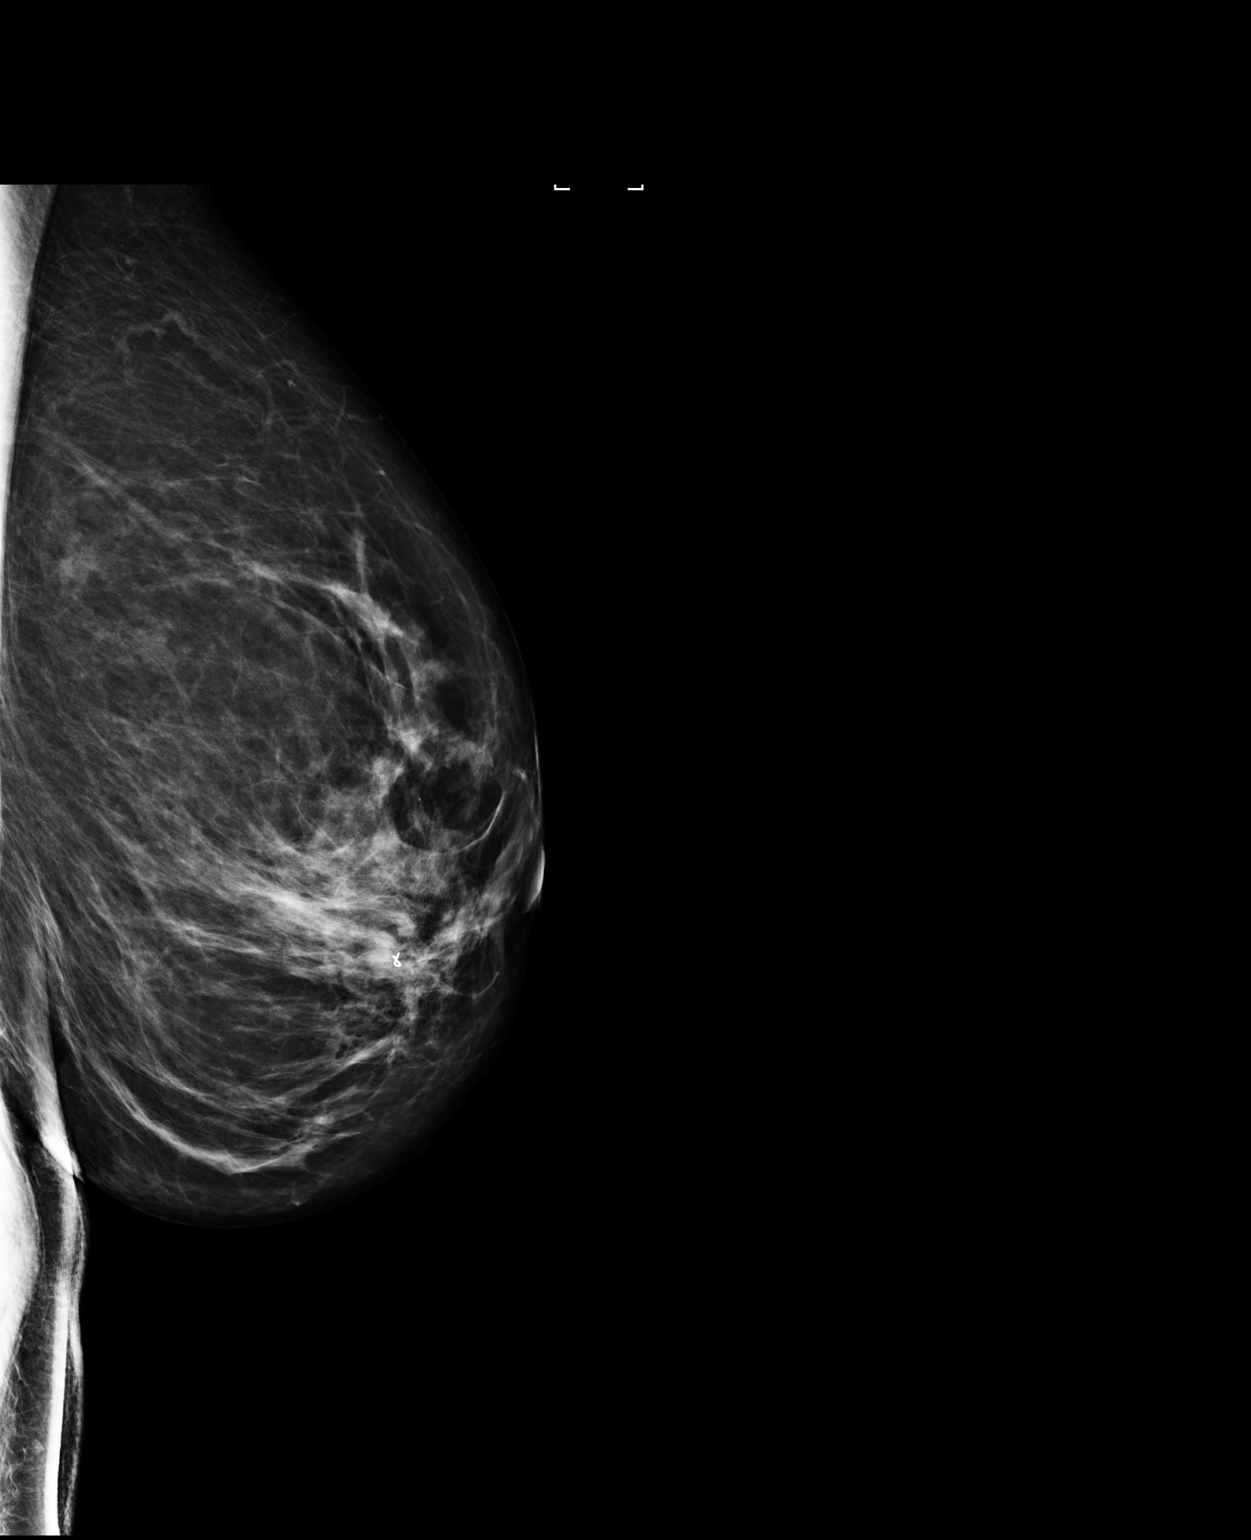

[L CC]
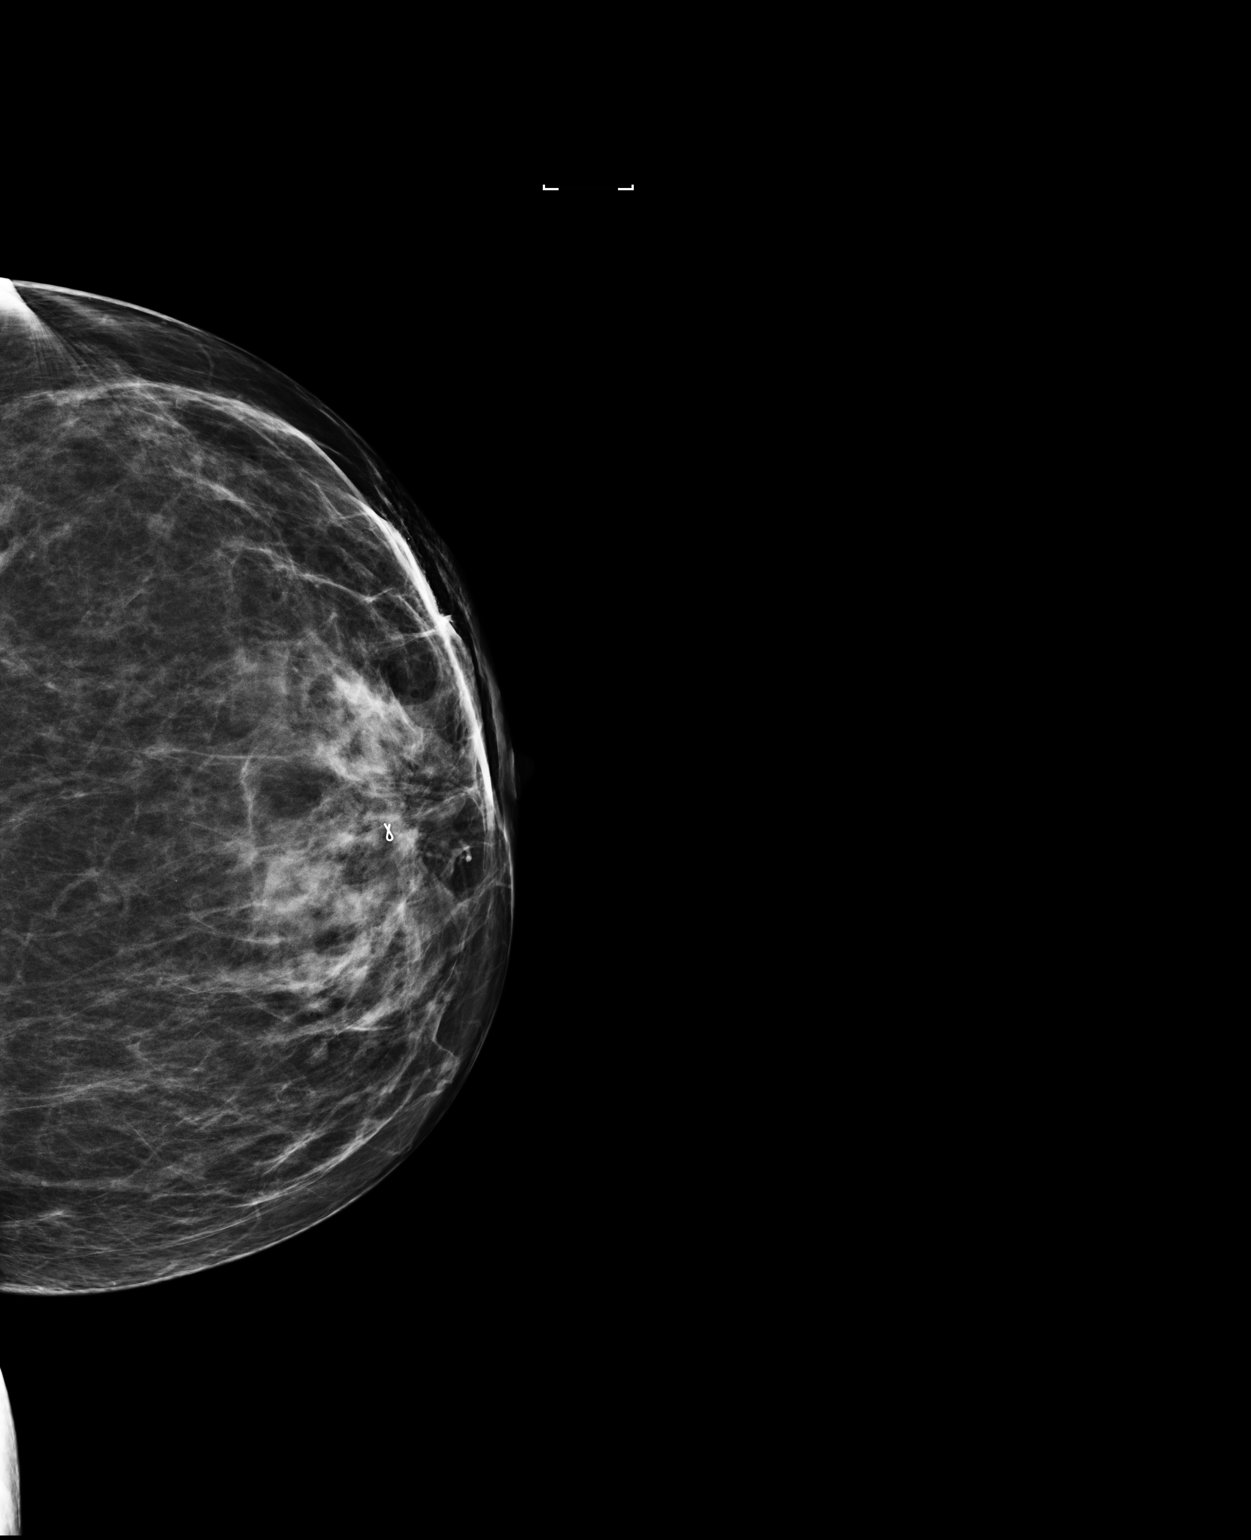

[2 of 2 positions shown; findings below may reference images not displayed]

FINDINGS: Mammographic images were obtained following ultrasound guided biopsy
of the targeted mass at the 6 o'clock position of the left
periareolar region. Images demonstrate satisfactory placement of a
ribbon shaped metallic clip within the biopsied mass.
IMPRESSION: Satisfactory placement of a ribbon shaped metallic clip within the
biopsied mass at the 6 o'clock position.

Final Assessment: Post Procedure Mammograms for Marker Placement

## 2015-07-16 IMAGING — MG MM BREAST SURGICAL SPECIMEN
1 series · 1 of 1 positions shown · non-contrast
Comparison: none

[L SPECIMEN]
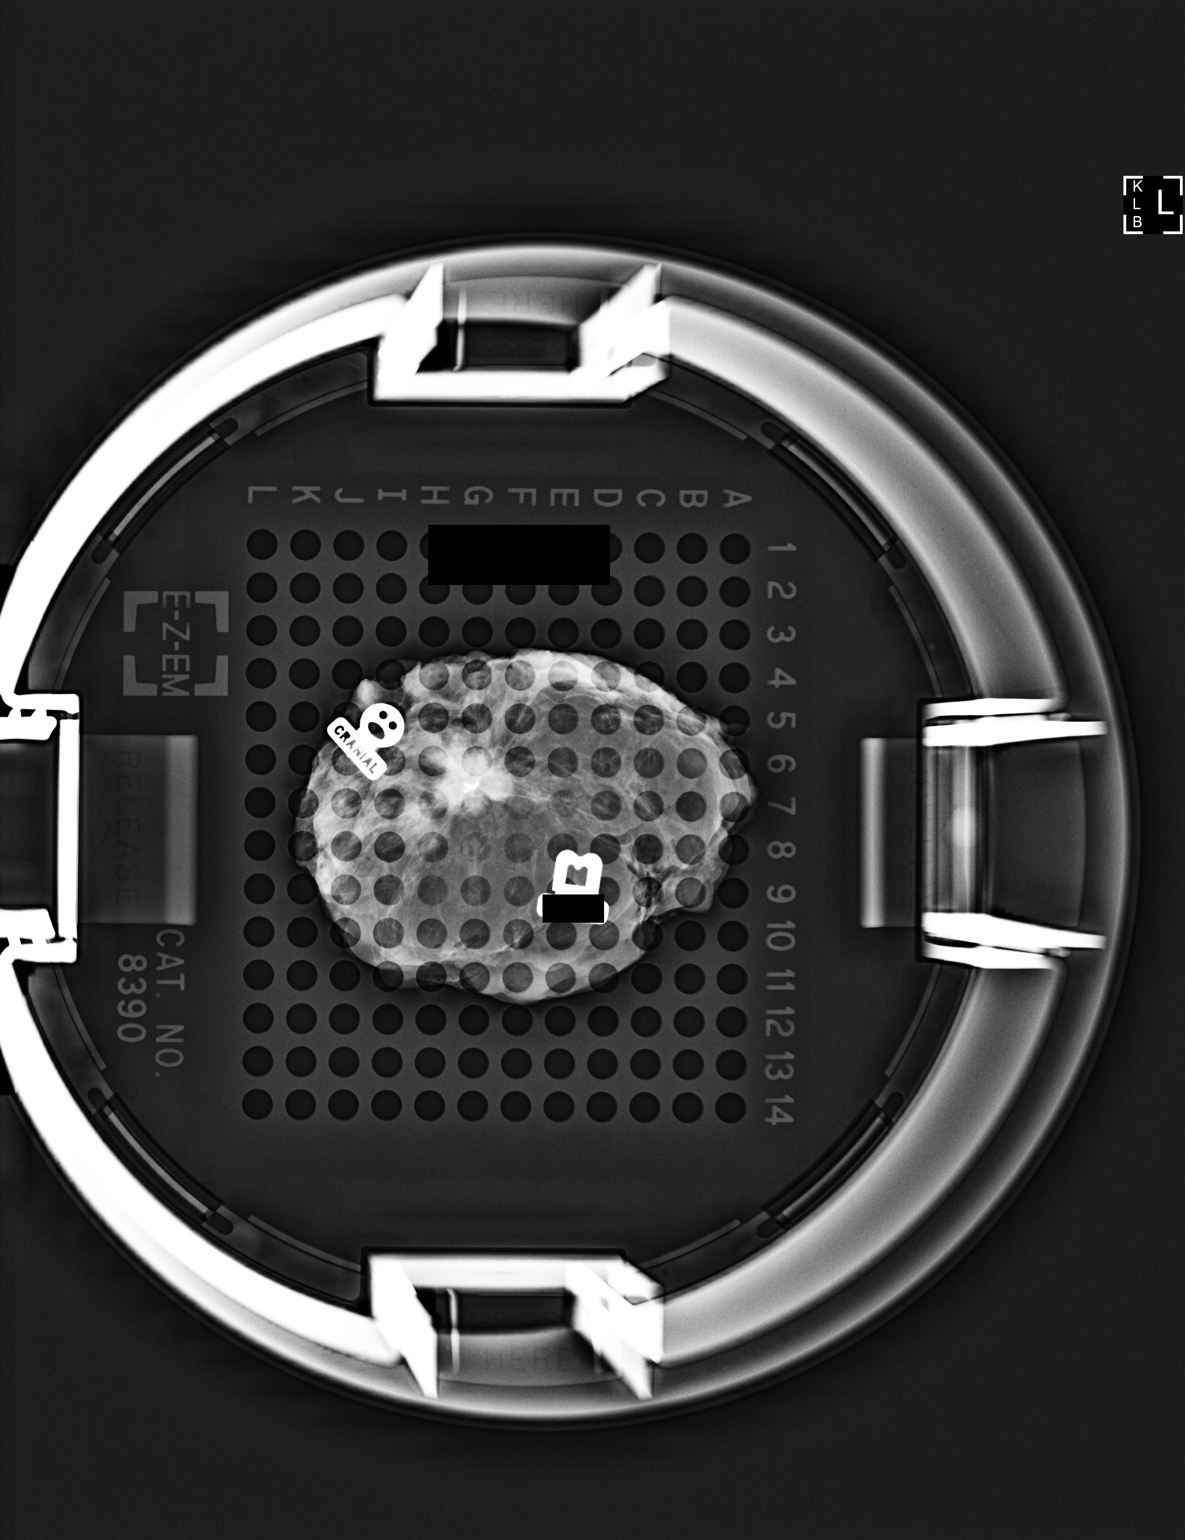

[1 of 1 positions shown; findings below may reference images not displayed]

Canned report from images found in remote index.

Refer to host system for actual result text.

## 2015-07-21 ENCOUNTER — Ambulatory Visit (INDEPENDENT_AMBULATORY_CARE_PROVIDER_SITE_OTHER): Payer: No Typology Code available for payment source | Admitting: General Surgery

## 2015-07-21 ENCOUNTER — Encounter: Payer: Self-pay | Admitting: General Surgery

## 2015-07-21 VITALS — BP 114/68 | HR 80 | Resp 12 | Ht 59.0 in | Wt 120.0 lb

## 2015-07-21 DIAGNOSIS — C50912 Malignant neoplasm of unspecified site of left female breast: Secondary | ICD-10-CM

## 2015-07-21 NOTE — Patient Instructions (Addendum)
The patient is aware to call back for any questions or concerns. Patient to have a bilateral diagnostic mammogram follow up in 6 months.  

## 2015-07-21 NOTE — Progress Notes (Addendum)
Patient ID: Amanda Odom, female   DOB: [birth year], 59 y.o.   MRN: [id number]  Chief Complaint  Patient presents with  . Follow-up    left breast cancer    HPI Amanda Odom is a 59 y.o. female.  Here today for follow up left breast cancer. She states sh has not noticed any drainage from the small open area that remains in left breast. She discontinued Femara on 04/14/15 since she experienced dizziness, vomiting, diarrhea occasional leg pain and headaches. Since she stopped taking it she is feeling better. She never did take the tamoxifen, she feared the side effects. Patient doing well since her thyroidectomy   HPI  Past Medical History  Diagnosis Date  . GERD (gastroesophageal reflux disease)   . Thyroid disease     Abnormal FNA, thyroidectomy pending with [name], MD  . Breast cancer of upper-inner quadrant of left female breast February 2016    T1c,N0; ER 90%, PR 50-90%, HER-2/neu not amplified by fish.  . Breast cancer   . Migraines     Past Surgical History  Procedure Laterality Date  . Abdominal hysterectomy    . Tonsillectomy  1962  . Bladder suspension  2004  . Colonoscopy  2011  . Lymph node biopsy Left 01/15/15  . Breast surgery Left 01/15/15    Wide excision, mastoplasty, sentinel node biopsy.  . Breast mammosite  01/2015  . 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    No family history on file.  Social History Social History  Substance Use Topics  . Smoking status: Never Smoker   . Smokeless tobacco: Never Used  . Alcohol Use: No    Allergies  Allergen Reactions  . Sulfamethoxazole-Trimethoprim Diarrhea and Nausea And Vomiting    Current Outpatient Prescriptions  Medication Sig Dispense Refill  . Ascorbic Acid (VITAMIN C) 1000 MG tablet Take 1,000 mg by mouth daily.    . Coenzyme Q10 (CO Q 10 PO) Take 1 capsule by mouth daily.    Marland Kitchen CORAL CALCIUM PO Take 2 capsules by mouth daily.    . Flaxseed, Linseed, (FLAXSEED OIL) 1000 MG  CAPS Take 1 capsule by mouth daily.    . Grape Seed Extract 100 MG CAPS Take by mouth daily.    . Lactobacillus (ACIDOPHILUS PO) Take 1 tablet by mouth daily.    Marland Kitchen levothyroxine (SYNTHROID, LEVOTHROID) 50 MCG tablet Take 100 mcg by mouth daily before breakfast.     . Misc Natural Products (CURCUMAX PRO PO) Take 1 tablet by mouth daily.    . Multiple Vitamin (MULTIVITAMIN) tablet Take 1 tablet by mouth daily.    Marland Kitchen SYNTHROID 100 MCG tablet Take 100 mcg by mouth daily.  0   No current facility-administered medications for this visit.    Review of Systems Review of Systems  Constitutional: Negative.   Respiratory: Negative.   Cardiovascular: Negative.     Blood pressure 114/68, pulse 80, resp. rate 12, height '4\' 11"'  (1.499 m), weight 120 lb (54.432 kg).  Physical Exam Physical Exam  Constitutional: She is oriented to person, place, and time. She appears well-developed and well-nourished.  HENT:  Mouth/Throat: Oropharynx is clear and moist.  Eyes: Conjunctivae are normal. No scleral icterus.  Neck: Neck supple.  Cardiovascular: Normal rate, regular rhythm and normal heart sounds.   Pulmonary/Chest: Effort normal and breath sounds normal. Right breast exhibits no inverted nipple, no mass, no nipple discharge, no skin change and no tenderness. Left breast exhibits  no inverted nipple, no mass, no nipple discharge, no skin change and no tenderness.    Near complete healing of 6 o'clock incision with slight volume loss under areolar left breast.  Lymphadenopathy:    She has no cervical adenopathy.    She has no axillary adenopathy.  Neurological: She is alert and oriented to person, place, and time.  Skin: Skin is warm and dry.  Psychiatric: Her behavior is normal.    Data Reviewed ER/PR positive tumor.  Assessment    Near complete resolution of changes in the lower aspect of the left breast status post surgery and partial breast radiation.    Plan    The patient completed 8  of 10 planned radiation treatments, the procedure halted due to the skin changes. She did not respond well to Femara, reporting multiple side effects. We had discussed a trial of tamoxifen last visit and she is concerned about the risk of stroke. Certainly, there are risks with any hormone manipulation, that it is very modest compared to the chance of a 50% reduction in recurrent malignancy.    The opportunity to make use of one of the other aromatase inhibitors was discussed.    She'll consider this.to have a bilateral diagnostic mammogram follow up in 6 months.   PCP:  Rayann Heman 08/05/2015, 4:43 PM

## 2015-08-20 ENCOUNTER — Ambulatory Visit: Payer: No Typology Code available for payment source | Admitting: Oncology

## 2015-09-28 ENCOUNTER — Encounter: Payer: Self-pay | Admitting: Radiation Oncology

## 2015-09-28 ENCOUNTER — Ambulatory Visit
Admission: RE | Admit: 2015-09-28 | Discharge: 2015-09-28 | Disposition: A | Discharge status: Home / Self Care | Payer: No Typology Code available for payment source | Source: Ambulatory Visit | Attending: Radiation Oncology | Admitting: Radiation Oncology

## 2015-09-28 VITALS — BP 155/99 | HR 85 | Temp 96.0°F | Resp 20 | Wt 119.5 lb

## 2015-09-28 DIAGNOSIS — C50912 Malignant neoplasm of unspecified site of left female breast: Secondary | ICD-10-CM

## 2015-09-28 NOTE — Progress Notes (Signed)
Radiation Oncology Follow up Note  Name: Amanda Odom   Date:   09/28/2015 MRN:  [id number] DOB: 12/03/1955    This 59 y.o. female presents to the clinic today for follow-up for stage I breast cancer ER/PR positive HER-2/neu negative now out 7 months.  REFERRING PROVIDER: [name], PA-C  HPI: Patient is a 59 year old female now out 7 months having completed accelerated partial breast irradiation to her left breast for a 1.1 cm invasive mammary carcinoma ER/PR positive HER-2/neu not overexpressed. Seen today in routine follow-up she is doing well. Has not recently had mammograms is scheduled to see surgeon in March at which time I believe mammograms will be scheduled. She was also on Femara caused considerable nausea she is discontinue that he does not want to have replacement aromatase inhibitor. She specifically denies breast tenderness cough or bone pain..  COMPLICATIONS OF TREATMENT: none  FOLLOW UP COMPLIANCE: keeps appointments   PHYSICAL EXAM:  BP 155/99 mmHg  Pulse 85  Temp(Src) 96 F (35.6 C)  Resp 20  Wt 119 lb 7.8 oz (54.2 kg) Lungs are clear to A&P cardiac examination essentially unremarkable with regular rate and rhythm. No dominant mass or nodularity is noted in either breast in 2 positions examined. Incision is well-healed. No axillary or supraclavicular adenopathy is appreciated. Cosmetic result is excellent. Well-developed well-nourished patient in NAD. HEENT reveals PERLA, EOMI, discs not visualized.  Oral cavity is clear. No oral mucosal lesions are identified. Neck is clear without evidence of cervical or supraclavicular adenopathy. Lungs are clear to A&P. Cardiac examination is essentially unremarkable with regular rate and rhythm without murmur rub or thrill. Abdomen is benign with no organomegaly or masses noted. Motor sensory and DTR levels are equal and symmetric in the upper and lower extremities. Cranial nerves II through XII are grossly intact.  Proprioception is intact. No peripheral adenopathy or edema is identified. No motor or sensory levels are noted. Crude visual fields are within normal range.  RADIOLOGY RESULTS: No current films for review  PLAN: I have expressed to the patient the importance of aromatase inhibitor therapy that she can switch to another 1 and may want discussed that with her surgeon. Also needs to have follow-up mammograms and I've asked her to discuss that with her surgeon also. Otherwise I'm please were overall progress. She continues to do well. I've asked to see her back in 1 year for follow-up. She knows to call sooner with any concerns.  I would like to take this opportunity for allowing me to participate in the care of your patient.Armstead Peaks., MD

## 2016-01-15 ENCOUNTER — Encounter: Payer: Self-pay | Admitting: General Surgery

## 2016-01-26 ENCOUNTER — Ambulatory Visit: Payer: No Typology Code available for payment source | Admitting: General Surgery

## 2016-02-04 ENCOUNTER — Encounter: Payer: Self-pay | Admitting: General Surgery

## 2016-02-04 ENCOUNTER — Ambulatory Visit (INDEPENDENT_AMBULATORY_CARE_PROVIDER_SITE_OTHER): Payer: BLUE CROSS/BLUE SHIELD | Admitting: General Surgery

## 2016-02-04 VITALS — BP 116/74 | HR 70 | Resp 12 | Ht 59.0 in | Wt 118.0 lb

## 2016-02-04 DIAGNOSIS — C50912 Malignant neoplasm of unspecified site of left female breast: Secondary | ICD-10-CM | POA: Diagnosis not present

## 2016-02-04 NOTE — Patient Instructions (Signed)
The patient has been asked to return to the office in one year with a bilateral diagnostic mammogram.Continue self breast exams. Call office for any new breast issues or concerns.  

## 2016-02-04 NOTE — Progress Notes (Signed)
Patient ID: Amanda Odom, female   DOB: [birth year], 60 y.o.   MRN: [id number]  Chief Complaint  Patient presents with  . Follow-up    mammogram    HPI Amanda Odom is a 60 y.o. female who presents for a breast evaluation. The most recent mammogram was done on 01/15/16.  Patient does perform regular self breast checks and gets regular mammograms done.   The patient has discontinued tamoxifen reporting unacceptable side effects. She reports in discussion with [name] no other agents were available ( previously failed Femara). I personally reviewed the patient's history.  HPI  Past Medical History  Diagnosis Date  . GERD (gastroesophageal reflux disease)   . Thyroid disease     Abnormal FNA, thyroidectomy pending with [name], MD  . Breast cancer of upper-inner quadrant of left female breast [facility]) February 2016    T1c,N0; ER 90%, PR 50-90%, HER-2/neu not amplified by fish.  . Breast cancer ([place])   . Migraines     Past Surgical History  Procedure Laterality Date  . Abdominal hysterectomy    . Tonsillectomy  1962  . Bladder suspension  2004  . Colonoscopy  2011  . Lymph node biopsy Left 01/15/15  . Breast surgery Left 01/15/15    Wide excision, mastoplasty, sentinel node biopsy.  . Breast mammosite  01/2015  . 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    No family history on file.  Social History Social History  Substance Use Topics  . Smoking status: Never Smoker   . Smokeless tobacco: Never Used  . Alcohol Use: No    Allergies  Allergen Reactions  . Sulfamethoxazole-Trimethoprim Diarrhea and Nausea And Vomiting    Current Outpatient Prescriptions  Medication Sig Dispense Refill  . Ascorbic Acid (VITAMIN C) 1000 MG tablet Take 1,000 mg by mouth daily.    . Coenzyme Q10 (CO Q 10 PO) Take 1 capsule by mouth daily.    Marland Kitchen CORAL CALCIUM PO Take 2 capsules by mouth daily.    . Flaxseed, Linseed, (FLAXSEED OIL) 1000 MG CAPS Take 1 capsule by  mouth daily.    . Grape Seed Extract 100 MG CAPS Take by mouth daily.    . Lactobacillus (ACIDOPHILUS PO) Take 1 tablet by mouth daily.    . Misc Natural Products (CURCUMAX PRO PO) Take 1 tablet by mouth daily.    . Multiple Vitamin (MULTIVITAMIN) tablet Take 1 tablet by mouth daily.    Marland Kitchen SYNTHROID 100 MCG tablet   2   No current facility-administered medications for this visit.    Review of Systems Review of Systems  Constitutional: Negative.   Respiratory: Negative.   Cardiovascular: Negative.     Blood pressure 116/74, pulse 70, resp. rate 12, height '4\' 11"'  (1.499 m), weight 118 lb (53.524 kg).  Physical Exam Physical Exam  Constitutional: She is oriented to person, place, and time. She appears well-developed and well-nourished.  Eyes: Conjunctivae are normal. No scleral icterus.  Neck: Neck supple.  Cardiovascular: Normal rate, regular rhythm and normal heart sounds.   Pulmonary/Chest: Breath sounds normal. Right breast exhibits no inverted nipple, no mass, no nipple discharge, no skin change and no tenderness. Left breast exhibits no inverted nipple, no mass, no nipple discharge, no skin change and no tenderness.    Left breast incision is clean and well healed . 3 mm little dome at 3 o'clock 3 cm from nipple.  Abdominal: Soft. Bowel sounds are normal. There is  no tenderness.  Lymphadenopathy:    She has no cervical adenopathy.    She has no axillary adenopathy.  Neurological: She is alert and oriented to person, place, and time.  Skin: Skin is warm and dry.    Data Reviewed Bilateral mammograms dated 01/15/2016 were reviewed. Postsurgical changes. BI-RADS-2.  Assessment    Doing well now one year out from management of her left breast cancer.    Plan    The patient was encouraged to consider trying several of the other agents available for hormone suppression. Evista or Arimidex could be considered. The patient's reluctance to upset the apple cart at this  time.  She'll consider whether she would make use of another trial, but based on today's discussion I think this is unlikely. She understands that she is increasing the risk of recurrent cancer, especially taking into light she only took 8 of 10 planned radiation treatments due to wound difficulties.       The patient has been asked to return to the office in one year with a bilateral diagnostic mammogram.   PCP:  [name]  This information has been scribed by Gaspar Cola CMA.   [name] 02/05/2016, 2:02 PM

## 2016-10-03 ENCOUNTER — Ambulatory Visit
Admission: RE | Admit: 2016-10-03 | Discharge: 2016-10-03 | Disposition: A | Discharge status: Home / Self Care | Payer: BLUE CROSS/BLUE SHIELD | Source: Ambulatory Visit | Attending: Radiation Oncology | Admitting: Radiation Oncology

## 2016-10-03 ENCOUNTER — Encounter: Payer: Self-pay | Admitting: Radiation Oncology

## 2016-10-03 VITALS — BP 162/90 | HR 72 | Temp 97.0°F | Wt 118.2 lb

## 2016-10-03 DIAGNOSIS — C50912 Malignant neoplasm of unspecified site of left female breast: Secondary | ICD-10-CM

## 2016-10-03 DIAGNOSIS — Z923 Personal history of irradiation: Secondary | ICD-10-CM | POA: Diagnosis not present

## 2016-10-03 DIAGNOSIS — Z853 Personal history of malignant neoplasm of breast: Secondary | ICD-10-CM | POA: Diagnosis not present

## 2016-10-03 NOTE — Progress Notes (Signed)
Radiation Oncology Follow up Note  Name: Amanda Odom   Date:   10/03/2016 MRN:  [id number] DOB: January 06, 1956    This 60 y.o. female presents to the clinic today for 90 month follow-up status post accelerated partial breast irradiation to her left breast for stage I ER/PR positive invasive mammary carcinoma.  REFERRING PROVIDER: [name], PA-C  HPI: Patient is a 60 year old female now out 19 months having completed accelerated partial breast radiation to her left breast for stage I ER/PR positive HER-2/neu negative invasive mammary carcinoma. She is seen today in routine follow-up and is doing well. Follow-up mammograms have been benign. She is not on any antiestrogen therapy she has failed both Femara and tamoxifen with significant side effects and is refused any other anti-hormonal therapy. She specifically denies breast tenderness cough or bone pain..  COMPLICATIONS OF TREATMENT: none  FOLLOW UP COMPLIANCE: keeps appointments   PHYSICAL EXAM:  BP (!) 162/90   Pulse 72   Temp 97 F (36.1 C)   Wt 118 lb 2.7 oz (53.6 kg)   BMI 23.87 kg/m  Lungs are clear to A&P cardiac examination essentially unremarkable with regular rate and rhythm. No dominant mass or nodularity is noted in either breast in 2 positions examined. Incision is well-healed. No axillary or supraclavicular adenopathy is appreciated. Cosmetic result is excellent. Well-developed well-nourished patient in NAD. HEENT reveals PERLA, EOMI, discs not visualized.  Oral cavity is clear. No oral mucosal lesions are identified. Neck is clear without evidence of cervical or supraclavicular adenopathy. Lungs are clear to A&P. Cardiac examination is essentially unremarkable with regular rate and rhythm without murmur rub or thrill. Abdomen is benign with no organomegaly or masses noted. Motor sensory and DTR levels are equal and symmetric in the upper and lower extremities. Cranial nerves II through XII are grossly intact.  Proprioception is intact. No peripheral adenopathy or edema is identified. No motor or sensory levels are noted. Crude visual fields are within normal range.  RADIOLOGY RESULTS: Mammograms are reviewed  PLAN: At the present time she continues to do well with no evidence of disease. I'm please were overall progress. I've asked to see her back in 1 year for follow-up escutcheon Re: Antiestrogen therapy as described above. Patient knows to call with any concerns.  I would like to take this opportunity to thank you for allowing me to participate in the care of your patient.Armstead Peaks., MD

## 2016-12-09 ENCOUNTER — Other Ambulatory Visit: Payer: Self-pay

## 2016-12-09 DIAGNOSIS — C50212 Malignant neoplasm of upper-inner quadrant of left female breast: Secondary | ICD-10-CM

## 2017-01-24 ENCOUNTER — Other Ambulatory Visit: Payer: Self-pay | Admitting: General Surgery

## 2017-01-24 ENCOUNTER — Ambulatory Visit
Admission: RE | Admit: 2017-01-24 | Discharge: 2017-01-24 | Disposition: A | Discharge status: Home / Self Care | Payer: BLUE CROSS/BLUE SHIELD | Source: Ambulatory Visit | Attending: General Surgery | Admitting: General Surgery

## 2017-01-24 ENCOUNTER — Encounter: Payer: Self-pay | Admitting: *Deleted

## 2017-01-24 DIAGNOSIS — C50212 Malignant neoplasm of upper-inner quadrant of left female breast: Secondary | ICD-10-CM | POA: Insufficient documentation

## 2017-01-31 ENCOUNTER — Ambulatory Visit (INDEPENDENT_AMBULATORY_CARE_PROVIDER_SITE_OTHER): Payer: BLUE CROSS/BLUE SHIELD | Admitting: General Surgery

## 2017-01-31 ENCOUNTER — Encounter: Payer: Self-pay | Admitting: General Surgery

## 2017-01-31 VITALS — BP 122/78 | HR 76 | Resp 12 | Ht 59.0 in | Wt 121.0 lb

## 2017-01-31 DIAGNOSIS — Z170 Estrogen receptor positive status [ER+]: Secondary | ICD-10-CM

## 2017-01-31 DIAGNOSIS — C50212 Malignant neoplasm of upper-inner quadrant of left female breast: Secondary | ICD-10-CM | POA: Diagnosis not present

## 2017-01-31 NOTE — Progress Notes (Addendum)
Patient ID: Amanda Odom, female   DOB: 09/15/1956, 61 y.o.   MRN: [id number]  Chief Complaint  Patient presents with  . Follow-up    HPI Amanda Odom is a 61 y.o. female. who presents for her follow up breast cancer and a breast evaluation. The most recent mammogram was done on 01-24-17 .  Patient does perform regular self breast checks and gets regular mammograms done.   No new breast issues.  She states the red spot on her left breast was from a rock hitting her while mowing last summer.  HPI  Past Medical History:  Diagnosis Date  . Breast cancer ([place]) 01/15/2015   left breast cancer/mammosite radiation  . Breast cancer of upper-inner quadrant of left female breast ([place]) 12/2014   T1c,N0; ER 90%, PR 50-90%, HER-2/neu not amplified by fish. Unable to tolerate estrogen suppression.  Marland Kitchen GERD (gastroesophageal reflux disease)   . Migraines   . 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 Total thyroidectomy. Amanda Sheffield, MD    Past Surgical History:  Procedure Laterality Date  . ABDOMINAL HYSTERECTOMY    . BLADDER SUSPENSION  2004  . BREAST BIOPSY Left 12/2014   positive  . BREAST EXCISIONAL BIOPSY Left 01/15/2015   positive  . BREAST MAMMOSITE  01/2015  . BREAST SURGERY Left 01/15/15   Wide excision, mastoplasty, sentinel node biopsy.  . COLONOSCOPY  2011  . LYMPH NODE BIOPSY Left 01/15/15  . THYROIDECTOMY  03-09-15   Dr [name]/ Hurthe cell adenoma.  . TONSILLECTOMY  1962    No family history on file.  Social History Social History  Substance Use Topics  . Smoking status: Never Smoker  . Smokeless tobacco: Never Used  . Alcohol use No    Allergies  Allergen Reactions  . Sulfamethoxazole-Trimethoprim Diarrhea and Nausea And Vomiting    Current Outpatient Prescriptions  Medication Sig Dispense Refill  . Ascorbic Acid (VITAMIN C) 1000 MG tablet Take 1,000 mg by mouth daily.    . Coenzyme Q10 (CO Q 10 PO) Take 1 capsule by mouth daily.    Marland Kitchen CORAL  CALCIUM PO Take 2 capsules by mouth daily.    . Grape Seed Extract 100 MG CAPS Take by mouth daily.    . Lactobacillus (ACIDOPHILUS PO) Take 1 tablet by mouth daily.    . Misc Natural Products (CURCUMAX PRO PO) Take 1 tablet by mouth daily.    . Multiple Vitamin (MULTIVITAMIN) tablet Take 1 tablet by mouth daily.    Marland Kitchen SYNTHROID 100 MCG tablet   2   No current facility-administered medications for this visit.     Review of Systems Review of Systems  Constitutional: Negative.   Respiratory: Negative.   Cardiovascular: Negative.     Blood pressure 122/78, pulse 76, resp. rate 12, height '4\' 11"'  (1.499 m), weight 121 lb (54.9 kg), SpO2 98 %.  Physical Exam Physical Exam  Constitutional: She is oriented to person, place, and time. She appears well-developed and well-nourished.  HENT:  Head:    Mouth/Throat: Oropharynx is clear and moist.  Eyes: Conjunctivae are normal. No scleral icterus.  Neck: Neck supple.  Cardiovascular: Normal rate, regular rhythm and normal heart sounds.   Pulmonary/Chest: Effort normal and breath sounds normal. Right breast exhibits no inverted nipple, no mass, no nipple discharge, no skin change and no tenderness. Left breast exhibits no inverted nipple, no mass, no nipple discharge, no skin change and no tenderness.    5 x 7  mm pigmented area 3 cm below nipple at 6 o'clock along scar left breast.  Lymphadenopathy:    She has no cervical adenopathy.    She has no axillary adenopathy.  Neurological: She is alert and oriented to person, place, and time.  Skin: Skin is warm and dry.  Psychiatric: Her behavior is normal.    Data Reviewed Bilateral mammograms dated 01/24/2017 reviewed. No interval change. BI-RADS-2.  Assessment    Mild volume loss in the lower aspect of the left breast    Plan         The patient has been asked to return to the office in one year with a bilateral diagnostic mammogram.   This information has been scribed by Karie Fetch RN, BSN,BC.   Amanda Odom 02/02/2017, 8:16 AM

## 2017-01-31 NOTE — Patient Instructions (Signed)
The patient is aware to call back for any questions or concerns.  

## 2017-02-01 ENCOUNTER — Encounter: Payer: Self-pay | Admitting: General Surgery

## 2017-02-02 ENCOUNTER — Encounter: Payer: Self-pay | Admitting: General Surgery

## 2017-10-26 ENCOUNTER — Other Ambulatory Visit: Payer: Self-pay

## 2017-10-26 ENCOUNTER — Encounter: Payer: Self-pay | Admitting: Radiation Oncology

## 2017-10-26 ENCOUNTER — Ambulatory Visit
Admission: RE | Admit: 2017-10-26 | Discharge: 2017-10-26 | Disposition: A | Discharge status: Home / Self Care | Payer: BLUE CROSS/BLUE SHIELD | Source: Ambulatory Visit | Attending: Radiation Oncology | Admitting: Radiation Oncology

## 2017-10-26 VITALS — BP 154/90 | HR 82 | Temp 96.6°F | Resp 18 | Wt 122.2 lb

## 2017-10-26 DIAGNOSIS — Z86000 Personal history of in-situ neoplasm of breast: Secondary | ICD-10-CM | POA: Diagnosis present

## 2017-10-26 DIAGNOSIS — C50912 Malignant neoplasm of unspecified site of left female breast: Secondary | ICD-10-CM

## 2017-10-26 DIAGNOSIS — Z923 Personal history of irradiation: Secondary | ICD-10-CM | POA: Insufficient documentation

## 2017-10-26 NOTE — Progress Notes (Signed)
Radiation Oncology Follow up Note  Name: Amanda Odom   Date:   10/26/2017 MRN:  [id number] DOB: [birth year]    This 61 y.o. female presents to the clinic today for to have year follow-up status post accelerated partial breast radiation to her left breast for stage I ER/PR positive invasive mammary carcinoma.Marland Kitchen  REFERRING PROVIDER: [name], PA-C  HPI: Patient is a 61 year old female now seen out 2 and half years having completed accelerated partial breast radiation to her left breast for ductal carcinoma in situ ER/PR positive. She is seen today in routine follow-up and is doing well. She specifically denies breast tenderness cough or bone pain.Marland Kitchen Her last mammogram was back in March 2018 which I have reviewed was BI-RADS 2 benign. Patient has discontinued antiestrogen therapy based on making her feel ill.  COMPLICATIONS OF TREATMENT: none  FOLLOW UP COMPLIANCE: keeps appointments   PHYSICAL EXAM:  BP (!) 154/90   Pulse 82   Temp (!) 96.6 F (35.9 C)   Resp 18   Wt 122 lb 3.9 oz (55.4 kg)   BMI 24.69 kg/m  Lungs are clear to A&P cardiac examination essentially unremarkable with regular rate and rhythm. No dominant mass or nodularity is noted in either breast in 2 positions examined. Incision is well-healed. No axillary or supraclavicular adenopathy is appreciated. Cosmetic result is excellent.  RADIOLOGY RESULTS: Mammograms are reviewed and compatible with the above-stated findings  PLAN: At this time patient continues to do well with no evidence of disease. I've asked to see her back in 1 year for follow-up. Patient knows to call with any concerns at any time.  I would like to take this opportunity to thank you for allowing me to participate in the care of your patient.Noreene Filbert, MD

## 2017-12-13 ENCOUNTER — Other Ambulatory Visit: Payer: Self-pay

## 2017-12-13 DIAGNOSIS — C50812 Malignant neoplasm of overlapping sites of left female breast: Secondary | ICD-10-CM

## 2017-12-13 DIAGNOSIS — Z170 Estrogen receptor positive status [ER+]: Principal | ICD-10-CM

## 2017-12-18 ENCOUNTER — Encounter: Payer: Self-pay | Admitting: *Deleted

## 2018-01-30 ENCOUNTER — Ambulatory Visit
Admission: RE | Admit: 2018-01-30 | Discharge: 2018-01-30 | Disposition: A | Discharge status: Home / Self Care | Payer: BLUE CROSS/BLUE SHIELD | Source: Ambulatory Visit | Attending: General Surgery | Admitting: General Surgery

## 2018-01-30 DIAGNOSIS — Z170 Estrogen receptor positive status [ER+]: Principal | ICD-10-CM

## 2018-01-30 DIAGNOSIS — C50812 Malignant neoplasm of overlapping sites of left female breast: Secondary | ICD-10-CM

## 2018-01-30 DIAGNOSIS — Z853 Personal history of malignant neoplasm of breast: Secondary | ICD-10-CM | POA: Insufficient documentation

## 2018-01-30 DIAGNOSIS — Z08 Encounter for follow-up examination after completed treatment for malignant neoplasm: Secondary | ICD-10-CM | POA: Insufficient documentation

## 2018-02-08 ENCOUNTER — Ambulatory Visit: Payer: BLUE CROSS/BLUE SHIELD | Admitting: General Surgery

## 2018-05-07 ENCOUNTER — Telehealth: Payer: Self-pay | Admitting: *Deleted

## 2018-05-07 NOTE — Telephone Encounter (Signed)
Patient want to be follow by her PCP for Mammograms.

## 2018-05-07 NOTE — Telephone Encounter (Signed)
Notified patient as instructed. Patient states she will think about who will follow her for her breast. Will call back and report soon.

## 2018-05-08 ENCOUNTER — Telehealth: Payer: Self-pay | Admitting: General Surgery

## 2018-05-08 NOTE — Telephone Encounter (Signed)
Patient called back & states she will be followed by Dr Baruch Gouty. She didn't feel she needed two.

## 2018-05-24 ENCOUNTER — Ambulatory Visit: Payer: BLUE CROSS/BLUE SHIELD | Admitting: General Surgery

## 2018-07-03 ENCOUNTER — Other Ambulatory Visit: Payer: Self-pay | Admitting: Ophthalmology

## 2018-07-03 DIAGNOSIS — H53432 Sector or arcuate defects, left eye: Secondary | ICD-10-CM

## 2018-07-06 ENCOUNTER — Other Ambulatory Visit: Payer: Self-pay | Admitting: Ophthalmology

## 2018-07-06 DIAGNOSIS — H469 Unspecified optic neuritis: Secondary | ICD-10-CM

## 2018-07-10 ENCOUNTER — Encounter (HOSPITAL_COMMUNITY): Payer: Self-pay

## 2018-07-10 ENCOUNTER — Ambulatory Visit (HOSPITAL_COMMUNITY): Payer: BLUE CROSS/BLUE SHIELD

## 2018-07-10 ENCOUNTER — Ambulatory Visit: Payer: BLUE CROSS/BLUE SHIELD

## 2018-07-10 ENCOUNTER — Ambulatory Visit (HOSPITAL_COMMUNITY)
Admission: RE | Admit: 2018-07-10 | Discharge: 2018-07-10 | Disposition: A | Discharge status: Home / Self Care | Payer: BLUE CROSS/BLUE SHIELD | Source: Ambulatory Visit | Attending: Ophthalmology | Admitting: Ophthalmology

## 2018-10-22 ENCOUNTER — Ambulatory Visit
Admission: RE | Admit: 2018-10-22 | Discharge: 2018-10-22 | Disposition: A | Discharge status: Home / Self Care | Payer: BLUE CROSS/BLUE SHIELD | Source: Ambulatory Visit | Attending: Radiation Oncology | Admitting: Radiation Oncology

## 2018-10-22 ENCOUNTER — Encounter: Payer: Self-pay | Admitting: Radiation Oncology

## 2018-10-22 ENCOUNTER — Other Ambulatory Visit: Payer: Self-pay

## 2018-10-22 VITALS — BP 159/89 | HR 79 | Temp 96.1°F | Resp 18 | Wt 122.0 lb

## 2018-10-22 DIAGNOSIS — Z86000 Personal history of in-situ neoplasm of breast: Secondary | ICD-10-CM | POA: Insufficient documentation

## 2018-10-22 DIAGNOSIS — Z923 Personal history of irradiation: Secondary | ICD-10-CM | POA: Insufficient documentation

## 2018-10-22 DIAGNOSIS — C50912 Malignant neoplasm of unspecified site of left female breast: Secondary | ICD-10-CM

## 2018-10-22 NOTE — Progress Notes (Signed)
Radiation Oncology Follow up Note  Name: Amanda Odom   Date:   10/22/2018 MRN:  [id number] DOB: 1956/10/03    This 62 y.o. female presents to the clinic today for 3 year follow-up status post accelerated partial breast radiation to her left breast for stage I ER/PR positive invasive mammary carcinoma.Marland Kitchen  REFERRING PROVIDER: [name], PA-C  HPI: patient is a 62 year old female now out 3 years having completed accelerated partial breast radiation to her left breast for ER/PR positive ductal carcinoma in situ. She is seen today in routine follow-up is doing well. She's discontinued anti-estrogen therapy based on side effect profile..last mammogram which I have reviewed was back in March 2019 was BI-RADS 2 benign. She specifically denies breast tenderness cough or bone pain.  COMPLICATIONS OF TREATMENT: none  FOLLOW UP COMPLIANCE: keeps appointments   PHYSICAL EXAM:  BP (!) 159/89 (BP Location: Right Arm, Patient Position: Sitting)   Pulse 79   Temp (!) 96.1 F (35.6 C) (Tympanic)   Resp 18   Wt 122 lb 0.4 oz (55.3 kg)   BMI 24.65 kg/m  Lungs are clear to A&P cardiac examination essentially unremarkable with regular rate and rhythm. No dominant mass or nodularity is noted in either breast in 2 positions examined. Incision is well-healed. No axillary or supraclavicular adenopathy is appreciated. Cosmetic result is excellent.Well-developed well-nourished patient in NAD. HEENT reveals PERLA, EOMI, discs not visualized.  Oral cavity is clear. No oral mucosal lesions are identified. Neck is clear without evidence of cervical or supraclavicular adenopathy. Lungs are clear to A&P. Cardiac examination is essentially unremarkable with regular rate and rhythm without murmur rub or thrill. Abdomen is benign with no organomegaly or masses noted. Motor sensory and DTR levels are equal and symmetric in the upper and lower extremities. Cranial nerves II through XII are grossly intact. Proprioception is  intact. No peripheral adenopathy or edema is identified. No motor or sensory levels are noted. Crude visual fields are within normal range.  RADIOLOGY RESULTS: mammograms are reviewed and compatible with the above-stated findings  PLAN: present time patient continues to do well with no evidence of disease. I'm please were overall progress. She has declined any further use of antiestrogen therapy. She's Re: Scheduled for follow-up mammograms after the first of the year. I have asked to see her back in 1 year for follow-up. Patient is to call with any concerns at any time.  I would like to take this opportunity to thank you for allowing me to participate in the care of your patient.Noreene Filbert, MD

## 2019-04-25 DIAGNOSIS — E041 Nontoxic single thyroid nodule: Secondary | ICD-10-CM | POA: Diagnosis not present

## 2019-04-25 DIAGNOSIS — E039 Hypothyroidism, unspecified: Secondary | ICD-10-CM | POA: Diagnosis not present

## 2019-04-25 DIAGNOSIS — Z6824 Body mass index (BMI) 24.0-24.9, adult: Secondary | ICD-10-CM | POA: Diagnosis not present

## 2019-04-25 DIAGNOSIS — Z1331 Encounter for screening for depression: Secondary | ICD-10-CM | POA: Diagnosis not present

## 2019-04-26 DIAGNOSIS — Z1211 Encounter for screening for malignant neoplasm of colon: Secondary | ICD-10-CM | POA: Diagnosis not present

## 2019-05-08 ENCOUNTER — Other Ambulatory Visit: Payer: Self-pay | Admitting: Nurse Practitioner

## 2019-05-08 DIAGNOSIS — Z1231 Encounter for screening mammogram for malignant neoplasm of breast: Secondary | ICD-10-CM

## 2019-05-16 ENCOUNTER — Other Ambulatory Visit: Payer: Self-pay

## 2019-05-16 ENCOUNTER — Ambulatory Visit
Admission: RE | Admit: 2019-05-16 | Discharge: 2019-05-16 | Disposition: A | Discharge status: Home / Self Care | Payer: Medicare Other | Source: Ambulatory Visit | Attending: Nurse Practitioner | Admitting: Nurse Practitioner

## 2019-05-16 DIAGNOSIS — Z853 Personal history of malignant neoplasm of breast: Secondary | ICD-10-CM | POA: Diagnosis not present

## 2019-05-16 DIAGNOSIS — Z1231 Encounter for screening mammogram for malignant neoplasm of breast: Secondary | ICD-10-CM

## 2019-05-16 DIAGNOSIS — R928 Other abnormal and inconclusive findings on diagnostic imaging of breast: Secondary | ICD-10-CM | POA: Diagnosis not present

## 2019-05-16 HISTORY — DX: Personal history of irradiation: Z92.3

## 2019-06-03 DIAGNOSIS — E041 Nontoxic single thyroid nodule: Secondary | ICD-10-CM | POA: Diagnosis not present

## 2020-04-06 DIAGNOSIS — H2513 Age-related nuclear cataract, bilateral: Secondary | ICD-10-CM | POA: Diagnosis not present

## 2020-05-08 ENCOUNTER — Other Ambulatory Visit: Payer: Self-pay | Admitting: Nurse Practitioner

## 2020-05-08 DIAGNOSIS — Z1231 Encounter for screening mammogram for malignant neoplasm of breast: Secondary | ICD-10-CM

## 2020-05-11 ENCOUNTER — Other Ambulatory Visit: Payer: Self-pay | Admitting: Nurse Practitioner

## 2020-05-11 DIAGNOSIS — Z853 Personal history of malignant neoplasm of breast: Secondary | ICD-10-CM

## 2020-05-11 DIAGNOSIS — Z9889 Other specified postprocedural states: Secondary | ICD-10-CM

## 2020-05-22 ENCOUNTER — Ambulatory Visit
Admission: RE | Admit: 2020-05-22 | Discharge: 2020-05-22 | Disposition: A | Discharge status: Home / Self Care | Payer: Medicare Other | Source: Ambulatory Visit | Attending: Nurse Practitioner | Admitting: Nurse Practitioner

## 2020-05-22 DIAGNOSIS — Z9889 Other specified postprocedural states: Secondary | ICD-10-CM

## 2020-05-22 DIAGNOSIS — Z853 Personal history of malignant neoplasm of breast: Secondary | ICD-10-CM | POA: Diagnosis not present

## 2020-07-02 DIAGNOSIS — Z23 Encounter for immunization: Secondary | ICD-10-CM | POA: Diagnosis not present

## 2021-03-29 ENCOUNTER — Other Ambulatory Visit: Payer: Self-pay | Admitting: Nurse Practitioner

## 2021-03-29 DIAGNOSIS — Z1231 Encounter for screening mammogram for malignant neoplasm of breast: Secondary | ICD-10-CM | POA: Diagnosis not present

## 2021-03-29 DIAGNOSIS — Z1211 Encounter for screening for malignant neoplasm of colon: Secondary | ICD-10-CM | POA: Diagnosis not present

## 2021-03-29 DIAGNOSIS — E041 Nontoxic single thyroid nodule: Secondary | ICD-10-CM | POA: Diagnosis not present

## 2021-03-29 DIAGNOSIS — E039 Hypothyroidism, unspecified: Secondary | ICD-10-CM | POA: Diagnosis not present

## 2021-03-29 DIAGNOSIS — Z6825 Body mass index (BMI) 25.0-25.9, adult: Secondary | ICD-10-CM | POA: Diagnosis not present

## 2021-03-29 DIAGNOSIS — Z139 Encounter for screening, unspecified: Secondary | ICD-10-CM | POA: Diagnosis not present

## 2021-03-29 DIAGNOSIS — Z853 Personal history of malignant neoplasm of breast: Secondary | ICD-10-CM | POA: Diagnosis not present

## 2021-04-05 DIAGNOSIS — Z1212 Encounter for screening for malignant neoplasm of rectum: Secondary | ICD-10-CM | POA: Diagnosis not present

## 2021-04-05 DIAGNOSIS — Z1211 Encounter for screening for malignant neoplasm of colon: Secondary | ICD-10-CM | POA: Diagnosis not present

## 2021-04-13 LAB — COLOGUARD: COLOGUARD: NEGATIVE

## 2021-04-13 LAB — EXTERNAL GENERIC LAB PROCEDURE: COLOGUARD: NEGATIVE

## 2021-05-06 DIAGNOSIS — Z0000 Encounter for general adult medical examination without abnormal findings: Secondary | ICD-10-CM | POA: Diagnosis not present

## 2021-05-06 DIAGNOSIS — Z1331 Encounter for screening for depression: Secondary | ICD-10-CM | POA: Diagnosis not present

## 2021-05-06 DIAGNOSIS — Z9181 History of falling: Secondary | ICD-10-CM | POA: Diagnosis not present

## 2021-05-25 ENCOUNTER — Ambulatory Visit
Admission: RE | Admit: 2021-05-25 | Discharge: 2021-05-25 | Disposition: A | Discharge status: Home / Self Care | Payer: Medicare Other | Source: Ambulatory Visit | Attending: Nurse Practitioner | Admitting: Nurse Practitioner

## 2021-05-25 ENCOUNTER — Other Ambulatory Visit: Payer: Self-pay

## 2021-05-25 DIAGNOSIS — Z1231 Encounter for screening mammogram for malignant neoplasm of breast: Secondary | ICD-10-CM | POA: Insufficient documentation

## 2021-09-27 DIAGNOSIS — E039 Hypothyroidism, unspecified: Secondary | ICD-10-CM | POA: Diagnosis not present

## 2021-09-27 DIAGNOSIS — K5900 Constipation, unspecified: Secondary | ICD-10-CM | POA: Diagnosis not present

## 2021-09-27 DIAGNOSIS — E785 Hyperlipidemia, unspecified: Secondary | ICD-10-CM | POA: Diagnosis not present

## 2021-09-27 DIAGNOSIS — Z6824 Body mass index (BMI) 24.0-24.9, adult: Secondary | ICD-10-CM | POA: Diagnosis not present

## 2021-11-19 DIAGNOSIS — H43813 Vitreous degeneration, bilateral: Secondary | ICD-10-CM | POA: Diagnosis not present

## 2021-12-01 DIAGNOSIS — H2511 Age-related nuclear cataract, right eye: Secondary | ICD-10-CM | POA: Diagnosis not present

## 2021-12-06 ENCOUNTER — Encounter: Payer: Self-pay | Admitting: Ophthalmology

## 2021-12-16 NOTE — Discharge Instructions (Signed)

## 2021-12-21 ENCOUNTER — Encounter: Payer: Self-pay | Admitting: Ophthalmology

## 2021-12-21 ENCOUNTER — Encounter
Admission: RE | Disposition: A | Discharge status: Home / Self Care | Payer: Self-pay | Source: Home / Self Care | Attending: Ophthalmology

## 2021-12-21 ENCOUNTER — Ambulatory Visit
Admission: RE | Admit: 2021-12-21 | Discharge: 2021-12-21 | Disposition: A | Discharge status: Home / Self Care | Payer: Medicare Other | Attending: Ophthalmology | Admitting: Ophthalmology

## 2021-12-21 ENCOUNTER — Ambulatory Visit: Payer: Medicare Other | Admitting: Anesthesiology

## 2021-12-21 ENCOUNTER — Other Ambulatory Visit: Payer: Self-pay

## 2021-12-21 DIAGNOSIS — Z87891 Personal history of nicotine dependence: Secondary | ICD-10-CM | POA: Diagnosis not present

## 2021-12-21 DIAGNOSIS — H2511 Age-related nuclear cataract, right eye: Secondary | ICD-10-CM | POA: Insufficient documentation

## 2021-12-21 DIAGNOSIS — E039 Hypothyroidism, unspecified: Secondary | ICD-10-CM | POA: Diagnosis not present

## 2021-12-21 DIAGNOSIS — K219 Gastro-esophageal reflux disease without esophagitis: Secondary | ICD-10-CM | POA: Diagnosis not present

## 2021-12-21 DIAGNOSIS — H25811 Combined forms of age-related cataract, right eye: Secondary | ICD-10-CM | POA: Diagnosis not present

## 2021-12-21 HISTORY — DX: Other complications of anesthesia, initial encounter: T88.59XA

## 2021-12-21 HISTORY — PX: CATARACT EXTRACTION W/PHACO: SHX586

## 2021-12-21 SURGERY — PHACOEMULSIFICATION, CATARACT, WITH IOL INSERTION
Anesthesia: Monitor Anesthesia Care | Laterality: Right

## 2021-12-21 MED ORDER — MOXIFLOXACIN HCL 0.5 % OP SOLN
OPHTHALMIC | Status: DC | PRN
  Administered 2021-12-21: 0.2 mL via OPHTHALMIC

## 2021-12-21 MED ORDER — SIGHTPATH DOSE#1 BSS IO SOLN
INTRAOCULAR | Status: DC | PRN
  Administered 2021-12-21: 2 mL

## 2021-12-21 MED ORDER — SIGHTPATH DOSE#1 NA CHONDROIT SULF-NA HYALURON 40-17 MG/ML IO SOLN
INTRAOCULAR | Status: DC | PRN
  Administered 2021-12-21: 1 mL via INTRAOCULAR

## 2021-12-21 MED ORDER — ACETAMINOPHEN 325 MG PO TABS: 325.0000 mg | ORAL_TABLET | ORAL | Status: DC | PRN

## 2021-12-21 MED ORDER — BRIMONIDINE TARTRATE-TIMOLOL 0.2-0.5 % OP SOLN
OPHTHALMIC | Status: DC | PRN
  Administered 2021-12-21: 1 [drp] via OPHTHALMIC

## 2021-12-21 MED ORDER — ARMC OPHTHALMIC DILATING DROPS
1.0000 "application " | OPHTHALMIC | Status: DC | PRN
  Administered 2021-12-21 (×3): 1 via OPHTHALMIC

## 2021-12-21 MED ORDER — SIGHTPATH DOSE#1 BSS IO SOLN
INTRAOCULAR | Status: DC | PRN
  Administered 2021-12-21: 66 mL via OPHTHALMIC

## 2021-12-21 MED ORDER — ONDANSETRON HCL 4 MG/2ML IJ SOLN
INTRAMUSCULAR | Status: DC | PRN
  Administered 2021-12-21: 4 mg via INTRAVENOUS

## 2021-12-21 MED ORDER — SIGHTPATH DOSE#1 BSS IO SOLN
INTRAOCULAR | Status: DC | PRN
  Administered 2021-12-21: 15 mL via INTRAOCULAR

## 2021-12-21 MED ORDER — ACETAMINOPHEN 160 MG/5ML PO SOLN: 325.0000 mg | ORAL | Status: DC | PRN

## 2021-12-21 MED ORDER — MIDAZOLAM HCL 2 MG/2ML IJ SOLN
INTRAMUSCULAR | Status: DC | PRN
  Administered 2021-12-21: 2 mg via INTRAVENOUS

## 2021-12-21 MED ORDER — TETRACAINE HCL 0.5 % OP SOLN
1.0000 [drp] | OPHTHALMIC | Status: DC | PRN
  Administered 2021-12-21 (×3): 1 [drp] via OPHTHALMIC

## 2021-12-21 MED ORDER — LACTATED RINGERS IV SOLN: INTRAVENOUS | Status: DC

## 2021-12-21 SURGICAL SUPPLY — 18 items
CANNULA ANT/CHMB 27G (MISCELLANEOUS) IMPLANT
CANNULA ANT/CHMB 27GA (MISCELLANEOUS) ×2 IMPLANT
CATARACT SUITE SIGHTPATH (MISCELLANEOUS) ×2 IMPLANT
FEE CATARACT SUITE SIGHTPATH (MISCELLANEOUS) ×1 IMPLANT
GLOVE SURG ENC TEXT LTX SZ8 (GLOVE) ×2 IMPLANT
GLOVE SURG TRIUMPH 8.0 PF LTX (GLOVE) ×2 IMPLANT
LENS IOL ACRSF VT TRC 315 17.0 IMPLANT
LENS IOL ACRYSOF VIVITY 17.0 ×2 IMPLANT
LENS IOL VIVITY 315 17.0 ×1 IMPLANT
NDL FILTER BLUNT 18X1 1/2 (NEEDLE) ×1 IMPLANT
NEEDLE FILTER BLUNT 18X 1/2SAF (NEEDLE) ×1
NEEDLE FILTER BLUNT 18X1 1/2 (NEEDLE) ×1 IMPLANT
PACK VIT ANT 23G (MISCELLANEOUS) IMPLANT
RING MALYGIN (MISCELLANEOUS) IMPLANT
SUT ETHILON 10-0 CS-B-6CS-B-6 (SUTURE)
SUTURE EHLN 10-0 CS-B-6CS-B-6 (SUTURE) IMPLANT
SYR 3ML LL SCALE MARK (SYRINGE) ×2 IMPLANT
WATER STERILE IRR 250ML POUR (IV SOLUTION) ×2 IMPLANT

## 2021-12-21 NOTE — Op Note (Addendum)
PREOPERATIVE DIAGNOSIS:  Nuclear sclerotic cataract of the right eye.   POSTOPERATIVE DIAGNOSIS:  Nuclear sclerotic cataract of the right eye.   OPERATIVE PROCEDURE: Procedure(s): CATARACT EXTRACTION PHACO AND INTRAOCULAR LENS PLACEMENT (IOC) RIGHT vivity lens   SURGEON:  Birder Robson, MD.   ANESTHESIA: 1.      Managed anesthesia care. 2.     0.50ml of Shugarcaine was instilled following the paracentesis  Anesthesiologist: Rochel Brome, MD CRNA: Jeannene Patella, CRNA  COMPLICATIONS:  None.   TECHNIQUE:   Stop and chop    DESCRIPTION OF PROCEDURE:  The patient was examined and consented in the preoperative holding area where the aforementioned topical anesthesia was applied to the right eye.  The patient was brought back to the Operating Room where he was sat upright on the gurney and given a target to fixate upon while the eye was marked at the 3:00 and 9:00 position.  The patient was then reclined on the operating table.  The eye was prepped and draped in the usual sterile ophthalmic fashion and a lid speculum was placed. A paracentesis was created with the side port blade and the anterior chamber was filled with viscoelastic. A near clear corneal incision was performed with the steel keratome. A continuous curvilinear capsulorrhexis was performed with a cystotome followed by the capsulorrhexis forceps. Hydrodissection and hydrodelineation were carried out with BSS on a blunt cannula. The lens was removed in a stop and chop technique and the remaining cortical material was removed with the irrigation-aspiration handpiece. The eye was inflated with viscoelastic and the DFT  lens  was placed in the eye and rotated to within a few degrees of the predetermined orientation.  The remaining viscoelastic was removed from the eye.  The Sinskey hook was used to rotate the toric lens into its final resting place at 025 degrees.  0. The eye was inflated to a physiologic pressure and found to be  watertight. 0.23ml of Vigamox was placed in the anterior chamber.  The eye was dressed with Vigamo and [place]. The patient was given protective glasses to wear throughout the day and a shield with which to sleep tonight. The patient was also given drops with which to begin a drop regimen today and will follow-up with me in one day. Implant Name Type Inv. Item Serial No. Manufacturer Lot No. LRB No. Used Action  LENS IOL ACRYSOF VIVITY 17.0 - [id number]  LENS IOL ACRYSOF VIVITY 17.0 [id number] [employer]  Right 1 Implanted   Procedure(s) with comments: CATARACT EXTRACTION PHACO AND INTRAOCULAR LENS PLACEMENT (IOC) RIGHT vivity lens (Right) - 11.48 1:04.5  Electronically signed: Birder Robson 12/21/2021 10:35 AM

## 2021-12-21 NOTE — Transfer of Care (Signed)
Immediate Anesthesia Transfer of Care Note  Patient: Amanda Odom  Procedure(s) Performed: CATARACT EXTRACTION PHACO AND INTRAOCULAR LENS PLACEMENT (IOC) RIGHT vivity lens (Right)  Patient Location: PACU  Anesthesia Type: MAC  Level of Consciousness: awake, alert  and patient cooperative  Airway and Oxygen Therapy: Patient Spontanous Breathing and Patient connected to supplemental oxygen  Post-op Assessment: Post-op Vital signs reviewed, Patient's Cardiovascular Status Stable, Respiratory Function Stable, Patent Airway and No signs of Nausea or vomiting  Post-op Vital Signs: Reviewed and stable  Complications: No notable events documented.

## 2021-12-21 NOTE — Anesthesia Preprocedure Evaluation (Signed)
Anesthesia Evaluation  Patient identified by MRN, date of birth, ID band Patient awake    Reviewed: Allergy & Precautions, H&P , NPO status , Patient's Chart, lab work & pertinent test results, reviewed documented beta blocker date and time   History of Anesthesia Complications (+) PONV and history of anesthetic complications  Airway Mallampati: II  TM Distance: >3 FB Neck ROM: full    Dental no notable dental hx.    Pulmonary neg pulmonary ROS, former smoker,    Pulmonary exam normal breath sounds clear to auscultation       Cardiovascular Exercise Tolerance: Good negative cardio ROS   Rhythm:regular Rate:Normal     Neuro/Psych negative neurological ROS  negative psych ROS   GI/Hepatic Neg liver ROS, GERD  ,  Endo/Other  Hypothyroidism   Renal/GU negative Renal ROS  negative genitourinary   Musculoskeletal   Abdominal   Peds  Hematology negative hematology ROS (+)   Anesthesia Other Findings   Reproductive/Obstetrics negative OB ROS                             Anesthesia Physical Anesthesia Plan  ASA: 2  Anesthesia Plan: MAC   Post-op Pain Management:    Induction:   PONV Risk Score and Plan:   Airway Management Planned:   Additional Equipment:   Intra-op Plan:   Post-operative Plan:   Informed Consent: I have reviewed the patients History and Physical, chart, labs and discussed the procedure including the risks, benefits and alternatives for the proposed anesthesia with the patient or authorized representative who has indicated his/her understanding and acceptance.     Dental Advisory Given  Plan Discussed with: CRNA and Anesthesiologist  Anesthesia Plan Comments:         Anesthesia Quick Evaluation

## 2021-12-21 NOTE — H&P (Signed)
[facility]   Primary Care Physician:  Jeannene Patella, [place] Ophthalmologist: Dr. George Ina  Pre-Procedure History & Physical: HPI:  Amanda Odom is a 66 y.o. female here for cataract surgery.   Past Medical History:  Diagnosis Date   Breast cancer ([place]) 01/15/2015   left breast cancer/mammosite radiation   Breast cancer of upper-inner quadrant of left female breast ([place]) 12/2014   T1c,N0; ER 90%, PR 50-90%, HER-2/neu not amplified by fish. Unable to tolerate estrogen suppression.   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 Total thyroidectomy. Amanda Odom, Amanda Odom    Past Surgical History:  Procedure Laterality Date   ABDOMINAL HYSTERECTOMY     BLADDER SUSPENSION  2004   BREAST BIOPSY Left 12/2014   positive   BREAST EXCISIONAL BIOPSY Left 01/15/2015   positive   BREAST LUMPECTOMY Left 2016   with mammosite   BREAST MAMMOSITE  01/2015   BREAST SURGERY Left 01/15/15   Wide excision, mastoplasty, sentinel node biopsy.   COLONOSCOPY  2011   LYMPH NODE BIOPSY Left 01/15/15   THYROIDECTOMY  03-09-15   Dr [name]/ Hurthe cell adenoma.   TONSILLECTOMY  1962    Prior to Admission medications   Medication Sig Start Date End Date Taking? Authorizing Provider  Ascorbic Acid (VITAMIN C) 1000 MG tablet Take 1,000 mg by mouth daily.   Yes Provider, Historical, Amanda Odom  Coenzyme Q10 (CO Q 10 PO) Take 1 capsule by mouth daily.   Yes Provider, Historical, Amanda Odom  CORAL CALCIUM PO Take 2 capsules by mouth daily.   Yes Provider, Historical, Amanda Odom  famotidine (PEPCID) 10 MG tablet Take 10 mg by mouth 2 (two) times daily.   Yes Provider, Historical, Amanda Odom  Grape Seed Extract 100 MG CAPS Take by mouth daily.   Yes Provider, Historical, Amanda Odom  Lactobacillus (ACIDOPHILUS PO) Take 1 tablet by mouth daily.   Yes Provider, Historical, Amanda Odom  Misc Natural Products  (CURCUMAX PRO PO) Take 1 tablet by mouth daily.   Yes Provider, Historical, Amanda Odom  Multiple Vitamin (MULTIVITAMIN) tablet Take 1 tablet by mouth daily.   Yes Provider, Historical, Amanda Odom  SYNTHROID 100 MCG tablet  01/30/16  Yes Provider, Historical, Amanda Odom    Allergies as of 11/22/2021 - Review Complete 10/22/2018  Allergen Reaction Noted   Sulfamethoxazole-trimethoprim Diarrhea and Nausea And Vomiting 02/23/2015    Family History  Problem Relation Age of Onset   Breast cancer Neg Hx     Social History   Socioeconomic History   Marital status: Married    Spouse name: Not on file   Number of children: Not on file   Years of education: Not on file   Highest education level: Not on file  Occupational History   Not on file  Tobacco Use   Smoking status: Former    Types: Cigarettes    Quit date: 2015    Years since quitting: 8.1   Smokeless tobacco: Never  Substance and Sexual Activity   Alcohol use: No    Alcohol/week: 0.0 standard drinks   Drug use: No   Sexual activity: Not on file  Other Topics Concern   Not on file  Social History Narrative   Not on file   Social Determinants of Health   Financial Resource Strain: Not on file  Food Insecurity: Not on  file  Transportation Needs: Not on file  Physical Activity: Not on file  Stress: Not on file  Social Connections: Not on file  Intimate Partner Violence: Not on file    Review of Systems: See HPI, otherwise negative ROS  Physical Exam: BP (!) 156/87    Pulse 69    Temp (!) 97.3 F (36.3 C) (Temporal)    Resp 18    Ht '4\' 11"'  (1.499 m)    Wt 55.8 kg    SpO2 98%    BMI 24.84 kg/m  General:   Alert, cooperative in NAD Head:  Normocephalic and atraumatic. Respiratory:  Normal work of breathing. Cardiovascular:  RRR  Impression/Plan: Amanda Odom is here for cataract surgery.  Risks, benefits, limitations, and alternatives regarding cataract surgery have been reviewed with the patient.  Questions have been answered.   All parties agreeable.   Amanda Odom, Amanda Odom  12/21/2021, 10:00 AM

## 2021-12-21 NOTE — Anesthesia Procedure Notes (Signed)
Procedure Name: MAC Date/Time: 12/21/2021 10:15 AM Performed by: Jeannene Patella, CRNA Pre-anesthesia Checklist: Patient identified, Emergency Drugs available, Suction available, Timeout performed and Patient being monitored Patient Re-evaluated:Patient Re-evaluated prior to induction Oxygen Delivery Method: Nasal cannula Placement Confirmation: positive ETCO2

## 2021-12-21 NOTE — Anesthesia Postprocedure Evaluation (Signed)
Anesthesia Post Note  Patient: Amanda Odom  Procedure(s) Performed: CATARACT EXTRACTION PHACO AND INTRAOCULAR LENS PLACEMENT (IOC) RIGHT vivity lens (Right)     Patient location during evaluation: PACU Anesthesia Type: MAC Level of consciousness: awake and alert Pain management: pain level controlled Vital Signs Assessment: post-procedure vital signs reviewed and stable Respiratory status: spontaneous breathing, nonlabored ventilation, respiratory function stable and patient connected to nasal cannula oxygen Cardiovascular status: stable and blood pressure returned to baseline Postop Assessment: no apparent nausea or vomiting Anesthetic complications: no   No notable events documented.  Trecia Rogers

## 2021-12-22 ENCOUNTER — Encounter: Payer: Self-pay | Admitting: Ophthalmology

## 2021-12-23 ENCOUNTER — Encounter: Payer: Self-pay | Admitting: Ophthalmology

## 2021-12-27 DIAGNOSIS — H2512 Age-related nuclear cataract, left eye: Secondary | ICD-10-CM | POA: Diagnosis not present

## 2022-01-11 ENCOUNTER — Encounter: Payer: Self-pay | Admitting: Ophthalmology

## 2022-01-11 ENCOUNTER — Other Ambulatory Visit: Payer: Self-pay

## 2022-01-11 ENCOUNTER — Encounter
Admission: RE | Disposition: A | Discharge status: Home / Self Care | Payer: Self-pay | Source: Home / Self Care | Attending: Ophthalmology

## 2022-01-11 ENCOUNTER — Ambulatory Visit
Admission: RE | Admit: 2022-01-11 | Discharge: 2022-01-11 | Disposition: A | Discharge status: Home / Self Care | Payer: Medicare Other | Attending: Ophthalmology | Admitting: Ophthalmology

## 2022-01-11 ENCOUNTER — Ambulatory Visit: Payer: Medicare Other | Admitting: Anesthesiology

## 2022-01-11 DIAGNOSIS — K219 Gastro-esophageal reflux disease without esophagitis: Secondary | ICD-10-CM | POA: Diagnosis not present

## 2022-01-11 DIAGNOSIS — Z87891 Personal history of nicotine dependence: Secondary | ICD-10-CM | POA: Diagnosis not present

## 2022-01-11 DIAGNOSIS — Z79899 Other long term (current) drug therapy: Secondary | ICD-10-CM | POA: Diagnosis not present

## 2022-01-11 DIAGNOSIS — H25812 Combined forms of age-related cataract, left eye: Secondary | ICD-10-CM | POA: Diagnosis not present

## 2022-01-11 DIAGNOSIS — Z79890 Hormone replacement therapy (postmenopausal): Secondary | ICD-10-CM | POA: Diagnosis not present

## 2022-01-11 DIAGNOSIS — Z853 Personal history of malignant neoplasm of breast: Secondary | ICD-10-CM | POA: Insufficient documentation

## 2022-01-11 DIAGNOSIS — E079 Disorder of thyroid, unspecified: Secondary | ICD-10-CM | POA: Diagnosis not present

## 2022-01-11 DIAGNOSIS — H2512 Age-related nuclear cataract, left eye: Secondary | ICD-10-CM | POA: Diagnosis not present

## 2022-01-11 HISTORY — PX: CATARACT EXTRACTION W/PHACO: SHX586

## 2022-01-11 SURGERY — PHACOEMULSIFICATION, CATARACT, WITH IOL INSERTION
Anesthesia: Monitor Anesthesia Care | Site: Eye | Laterality: Left

## 2022-01-11 MED ORDER — SIGHTPATH DOSE#1 BSS IO SOLN
INTRAOCULAR | Status: DC | PRN
  Administered 2022-01-11: 65 mL via OPHTHALMIC

## 2022-01-11 MED ORDER — MIDAZOLAM HCL 2 MG/2ML IJ SOLN
INTRAMUSCULAR | Status: DC | PRN
  Administered 2022-01-11 (×2): 1 mg via INTRAVENOUS

## 2022-01-11 MED ORDER — BRIMONIDINE TARTRATE-TIMOLOL 0.2-0.5 % OP SOLN
OPHTHALMIC | Status: DC | PRN
  Administered 2022-01-11: 1 [drp] via OPHTHALMIC

## 2022-01-11 MED ORDER — SIGHTPATH DOSE#1 NA CHONDROIT SULF-NA HYALURON 40-17 MG/ML IO SOLN
INTRAOCULAR | Status: DC | PRN
  Administered 2022-01-11: 1 mL via INTRAOCULAR

## 2022-01-11 MED ORDER — SIGHTPATH DOSE#1 BSS IO SOLN
INTRAOCULAR | Status: DC | PRN
  Administered 2022-01-11: 1 mL via INTRAMUSCULAR

## 2022-01-11 MED ORDER — LACTATED RINGERS IV SOLN: INTRAVENOUS | Status: DC

## 2022-01-11 MED ORDER — MOXIFLOXACIN HCL 0.5 % OP SOLN
OPHTHALMIC | Status: DC | PRN
  Administered 2022-01-11: 0.2 mL via OPHTHALMIC

## 2022-01-11 MED ORDER — ARMC OPHTHALMIC DILATING DROPS
1.0000 "application " | OPHTHALMIC | Status: DC | PRN
  Administered 2022-01-11 (×3): 1 via OPHTHALMIC

## 2022-01-11 MED ORDER — SIGHTPATH DOSE#1 BSS IO SOLN
INTRAOCULAR | Status: DC | PRN
  Administered 2022-01-11: 15 mL

## 2022-01-11 MED ORDER — TETRACAINE HCL 0.5 % OP SOLN
1.0000 [drp] | OPHTHALMIC | Status: DC | PRN
  Administered 2022-01-11 (×3): 1 [drp] via OPHTHALMIC

## 2022-01-11 SURGICAL SUPPLY — 12 items
CATARACT SUITE SIGHTPATH (MISCELLANEOUS) ×2 IMPLANT
FEE CATARACT SUITE SIGHTPATH (MISCELLANEOUS) ×1 IMPLANT
GLOVE SURG ENC TEXT LTX SZ8 (GLOVE) ×2 IMPLANT
GLOVE SURG TRIUMPH 8.0 PF LTX (GLOVE) ×2 IMPLANT
LENS IOL ACRSF VT TRC 315 16.5 IMPLANT
LENS IOL ACRYSOF VIVITY 16.5 ×2 IMPLANT
LENS IOL VIVITY 315 16.5 ×1 IMPLANT
NDL FILTER BLUNT 18X1 1/2 (NEEDLE) ×1 IMPLANT
NEEDLE FILTER BLUNT 18X 1/2SAF (NEEDLE) ×1
NEEDLE FILTER BLUNT 18X1 1/2 (NEEDLE) ×1 IMPLANT
SYR 3ML LL SCALE MARK (SYRINGE) ×2 IMPLANT
WATER STERILE IRR 250ML POUR (IV SOLUTION) ×2 IMPLANT

## 2022-01-11 NOTE — Anesthesia Postprocedure Evaluation (Signed)
Anesthesia Post Note  Patient: Amanda Odom  Procedure(s) Performed: CATARACT EXTRACTION PHACO AND INTRAOCULAR LENS PLACEMENT (IOC) LEFT VIVITY LENS 10.48 00:54.9 (Left: Eye)     Patient location during evaluation: PACU Anesthesia Type: MAC Level of consciousness: awake and alert Pain management: pain level controlled Vital Signs Assessment: post-procedure vital signs reviewed and stable Respiratory status: spontaneous breathing, nonlabored ventilation, respiratory function stable and patient connected to nasal cannula oxygen Cardiovascular status: stable and blood pressure returned to baseline Postop Assessment: no apparent nausea or vomiting Anesthetic complications: no   No notable events documented.  Trecia Rogers

## 2022-01-11 NOTE — Transfer of Care (Signed)
Immediate Anesthesia Transfer of Care Note  Patient: Amanda Odom  Procedure(s) Performed: CATARACT EXTRACTION PHACO AND INTRAOCULAR LENS PLACEMENT (IOC) LEFT VIVITY LENS 10.48 00:54.9 (Left: Eye)  Patient Location: PACU  Anesthesia Type: MAC  Level of Consciousness: awake, alert  and patient cooperative  Airway and Oxygen Therapy: Patient Spontanous Breathing and Patient connected to supplemental oxygen  Post-op Assessment: Post-op Vital signs reviewed, Patient's Cardiovascular Status Stable, Respiratory Function Stable, Patent Airway and No signs of Nausea or vomiting  Post-op Vital Signs: Reviewed and stable  Complications: No notable events documented.

## 2022-01-11 NOTE — Op Note (Signed)
PREOPERATIVE DIAGNOSIS:  Nuclear sclerotic cataract of the left eye.   POSTOPERATIVE DIAGNOSIS:  Nuclear sclerotic cataract of the left eye.   OPERATIVE PROCEDURE: Procedure(s): CATARACT EXTRACTION PHACO AND INTRAOCULAR LENS PLACEMENT (IOC) LEFT VIVITY LENS 10.48 00:54.9   SURGEON:  Birder Robson, MD.   ANESTHESIA: 1.      Managed anesthesia care. 2.     0.93ml os Shugarcaine was instilled following the paracentesis [email or web address]   COMPLICATIONS:  None.   TECHNIQUE:   Stop and chop    DESCRIPTION OF PROCEDURE:  The patient was examined and consented in the preoperative holding area where the aforementioned topical anesthesia was applied to the left eye.  The patient was brought back to the Operating Room where he was sat upright on the gurney and given a target to fixate upon while the eye was marked at the 3:00 and 9:00 position.  The patient was then reclined on the operating table.  The eye was prepped and draped in the usual sterile ophthalmic fashion and a lid speculum was placed. A paracentesis was created with the side port blade and the anterior chamber was filled with viscoelastic. A near clear corneal incision was performed with the steel keratome. A continuous curvilinear capsulorrhexis was performed with a cystotome followed by the capsulorrhexis forceps. Hydrodissection and hydrodelineation were carried out with BSS on a blunt cannula. The lens was removed in a stop and chop technique and the remaining cortical material was removed with the irrigation-aspiration handpiece. The eye was inflated with viscoelastic and the ZCT lens was placed in the eye and rotated to within a few degrees of the predetermined orientation.  The remaining viscoelastic was removed from the eye.  The Sinskey hook was used to rotate the toric lens into its final resting place at 084 degrees.  0.1 ml of Vigamox was placed in the anterior chamber.The eye was inflated to a physiologic pressure and found to be  watertight.  The eye was dressed with Vigamox and [place] The patient was given protective glasses to wear throughout the day and a shield with which to sleep tonight. The patient was also given drops with which to begin a drop regimen today and will follow-up with me in one day. Implant Name Type Inv. Item Serial No. Manufacturer Lot No. LRB No. Used Action  LENS IOL ACRYSOF VIVITY 16.5 - [id number]  LENS IOL ACRYSOF VIVITY 16.5 [id number] [employer]  Left 1 Implanted   Procedure(s): CATARACT EXTRACTION PHACO AND INTRAOCULAR LENS PLACEMENT (IOC) LEFT VIVITY LENS 10.48 00:54.9 (Left)  Electronically signed: Birder Robson 2/28/20239:41 AM

## 2022-01-11 NOTE — H&P (Signed)
[facility]   Primary Care Physician:  Jeannene Patella, [place] Ophthalmologist: Dr. George Ina  Pre-Procedure History & Physical: HPI:  Amanda Odom is a 66 y.o. female here for cataract surgery.   Past Medical History:  Diagnosis Date   Breast cancer ([place]) 01/15/2015   left breast cancer/mammosite radiation   Breast cancer of upper-inner quadrant of left female breast ([place]) 12/2014   T1c,N0; ER 90%, PR 50-90%, HER-2/neu not amplified by fish. Unable to tolerate estrogen suppression.   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 Total thyroidectomy. [name], MD    Past Surgical History:  Procedure Laterality Date   ABDOMINAL HYSTERECTOMY     BLADDER SUSPENSION  2004   BREAST BIOPSY Left 12/2014   positive   BREAST EXCISIONAL BIOPSY Left 01/15/2015   positive   BREAST LUMPECTOMY Left 2016   with mammosite   BREAST MAMMOSITE  01/2015   BREAST SURGERY Left 01/15/15   Wide excision, mastoplasty, sentinel node biopsy.   CATARACT EXTRACTION W/PHACO Right 12/21/2021   Procedure: CATARACT EXTRACTION PHACO AND INTRAOCULAR LENS PLACEMENT ([place]) RIGHT vivity lens;  Surgeon: Birder Robson, MD;  Location: [facility];  Service: Ophthalmology;  Laterality: Right;  11.48 1:04.5   COLONOSCOPY  2011   LYMPH NODE BIOPSY Left 01/15/15   THYROIDECTOMY  03-09-15   Dr [name]/ Hurthe cell adenoma.   TONSILLECTOMY  1962    Prior to Admission medications   Medication Sig Start Date End Date Taking? Authorizing Provider  Ascorbic Acid (VITAMIN C) 1000 MG tablet Take 1,000 mg by mouth daily.   Yes [provider]  Coenzyme Q10 (CO Q 10 PO) Take 1 capsule by mouth daily.   Yes [provider]  CORAL CALCIUM PO Take 2 capsules by mouth daily.   Yes [provider]  famotidine (PEPCID) 10 MG tablet Take 10 mg  by mouth 2 (two) times daily.   Yes [provider]  Grape Seed Extract 100 MG CAPS Take by mouth daily.   Yes [provider]  Lactobacillus (ACIDOPHILUS PO) Take 1 tablet by mouth daily.   Yes [provider]  Misc Natural Products (CURCUMAX PRO PO) Take 1 tablet by mouth daily.   Yes [provider]  Multiple Vitamin (MULTIVITAMIN) tablet Take 1 tablet by mouth daily.   Yes [provider]  SYNTHROID 100 MCG tablet  01/30/16  Yes [provider]    Allergies as of 11/22/2021 - Review Complete 10/22/2018  Allergen Reaction Noted   Sulfamethoxazole-trimethoprim Diarrhea and Nausea And Vomiting 02/23/2015    Family History  Problem Relation Age of Onset   Breast cancer Neg Hx     Social History   Socioeconomic History   Marital status: Married    Spouse name: Not on file   Number of children: Not on file   Years of education: Not on file   Highest education level: Not on file  Occupational History   Not on file  Tobacco Use   Smoking status: Former    Types: Cigarettes    Quit date: 2015    Years since quitting: 8.1   Smokeless tobacco: Never  Substance and Sexual Activity   Alcohol use: No    Alcohol/week: 0.0 standard drinks   Drug use: No   Sexual activity:  Not on file  Other Topics Concern   Not on file  Social History Narrative   Not on file   Social Determinants of Health   Financial Resource Strain: Not on file  Food Insecurity: Not on file  Transportation Needs: Not on file  Physical Activity: Not on file  Stress: Not on file  Social Connections: Not on file  Intimate Partner Violence: Not on file    Review of Systems: See HPI, otherwise negative ROS  Physical Exam: BP (!) 145/87    Pulse 69    Temp (!) 96.7 F (35.9 C) (Temporal)    Resp 16    Wt 56.7 kg    SpO2 96%    BMI 25.25 kg/m  General:   Alert, cooperative in NAD Head:  Normocephalic and atraumatic. Respiratory:  Normal work of  breathing. Cardiovascular:  RRR  Impression/Plan: Amanda Odom is here for cataract surgery.  Risks, benefits, limitations, and alternatives regarding cataract surgery have been reviewed with the patient.  Questions have been answered.  All parties agreeable.   Birder Robson, MD  01/11/2022, 9:15 AM

## 2022-01-11 NOTE — Anesthesia Preprocedure Evaluation (Signed)
Anesthesia Evaluation  Patient identified by MRN, date of birth, ID band Patient awake    Reviewed: Allergy & Precautions, H&P , NPO status , Patient's Chart, lab work & pertinent test results, reviewed documented beta blocker date and time   History of Anesthesia Complications (+) PONV and history of anesthetic complications  Airway Mallampati: II  TM Distance: >3 FB Neck ROM: full    Dental no notable dental hx.    Pulmonary neg pulmonary ROS, former smoker,    Pulmonary exam normal breath sounds clear to auscultation       Cardiovascular Exercise Tolerance: Good negative cardio ROS Normal cardiovascular exam Rhythm:regular Rate:Normal     Neuro/Psych negative neurological ROS  negative psych ROS   GI/Hepatic Neg liver ROS, GERD  ,  Endo/Other  negative endocrine ROS  Renal/GU negative Renal ROS  negative genitourinary   Musculoskeletal   Abdominal   Peds  Hematology negative hematology ROS (+)   Anesthesia Other Findings   Reproductive/Obstetrics negative OB ROS                             Anesthesia Physical Anesthesia Plan  ASA: 2  Anesthesia Plan: MAC   Post-op Pain Management:    Induction:   PONV Risk Score and Plan:   Airway Management Planned:   Additional Equipment:   Intra-op Plan:   Post-operative Plan:   Informed Consent: I have reviewed the patients History and Physical, chart, labs and discussed the procedure including the risks, benefits and alternatives for the proposed anesthesia with the patient or authorized representative who has indicated his/her understanding and acceptance.     Dental Advisory Given  Plan Discussed with: CRNA and Anesthesiologist  Anesthesia Plan Comments:         Anesthesia Quick Evaluation

## 2022-04-06 DIAGNOSIS — K219 Gastro-esophageal reflux disease without esophagitis: Secondary | ICD-10-CM | POA: Diagnosis not present

## 2022-04-06 DIAGNOSIS — E039 Hypothyroidism, unspecified: Secondary | ICD-10-CM | POA: Diagnosis not present

## 2022-04-06 DIAGNOSIS — E785 Hyperlipidemia, unspecified: Secondary | ICD-10-CM | POA: Diagnosis not present

## 2022-04-06 DIAGNOSIS — Z853 Personal history of malignant neoplasm of breast: Secondary | ICD-10-CM | POA: Diagnosis not present

## 2022-04-06 DIAGNOSIS — Z139 Encounter for screening, unspecified: Secondary | ICD-10-CM | POA: Diagnosis not present

## 2022-04-13 ENCOUNTER — Other Ambulatory Visit: Payer: Self-pay | Admitting: Physician Assistant

## 2022-04-13 DIAGNOSIS — Z853 Personal history of malignant neoplasm of breast: Secondary | ICD-10-CM

## 2022-04-13 DIAGNOSIS — Z1231 Encounter for screening mammogram for malignant neoplasm of breast: Secondary | ICD-10-CM

## 2022-05-19 DIAGNOSIS — E039 Hypothyroidism, unspecified: Secondary | ICD-10-CM | POA: Diagnosis not present

## 2022-05-30 ENCOUNTER — Ambulatory Visit
Admission: RE | Admit: 2022-05-30 | Discharge: 2022-05-30 | Disposition: A | Discharge status: Home / Self Care | Payer: Medicare Other | Source: Ambulatory Visit | Attending: Physician Assistant | Admitting: Physician Assistant

## 2022-05-30 DIAGNOSIS — Z1231 Encounter for screening mammogram for malignant neoplasm of breast: Secondary | ICD-10-CM | POA: Diagnosis not present

## 2022-07-01 DIAGNOSIS — E039 Hypothyroidism, unspecified: Secondary | ICD-10-CM | POA: Diagnosis not present

## 2022-08-12 DIAGNOSIS — H43813 Vitreous degeneration, bilateral: Secondary | ICD-10-CM | POA: Diagnosis not present

## 2022-08-25 ENCOUNTER — Telehealth: Payer: Self-pay

## 2022-08-25 NOTE — Patient Outreach (Signed)
  Care Coordination   Initial Visit Note   08/25/2022 Name: Amanda Odom MRN: [id number] DOB: 1956/06/18  Amanda Odom is a 66 y.o. year old female who sees [name], [place] for primary care. I spoke with  Wende Bushy by phone today.  What matters to the patients health and wellness today?  Placed call to patient to offer and explain [facility] care coordination program. Patient reports that she is doing well and denies any needs at this time .     SDOH assessments and interventions completed:  No     Care Coordination Interventions Activated:  No  Care Coordination Interventions:  No, not indicated   Follow up plan: No further intervention required.   Encounter Outcome:  Pt. Refused   Tomasa Rand, RN, BSN, CEN [facility] [employer] [phone]

## 2022-10-10 DIAGNOSIS — Z9181 History of falling: Secondary | ICD-10-CM | POA: Diagnosis not present

## 2022-10-10 DIAGNOSIS — M8580 Other specified disorders of bone density and structure, unspecified site: Secondary | ICD-10-CM | POA: Diagnosis not present

## 2022-10-10 DIAGNOSIS — Z1331 Encounter for screening for depression: Secondary | ICD-10-CM | POA: Diagnosis not present

## 2022-10-10 DIAGNOSIS — E785 Hyperlipidemia, unspecified: Secondary | ICD-10-CM | POA: Diagnosis not present

## 2022-10-10 DIAGNOSIS — K219 Gastro-esophageal reflux disease without esophagitis: Secondary | ICD-10-CM | POA: Diagnosis not present

## 2022-10-10 DIAGNOSIS — E039 Hypothyroidism, unspecified: Secondary | ICD-10-CM | POA: Diagnosis not present

## 2023-04-17 ENCOUNTER — Other Ambulatory Visit: Payer: Self-pay | Admitting: Physician Assistant

## 2023-04-17 DIAGNOSIS — Z1231 Encounter for screening mammogram for malignant neoplasm of breast: Secondary | ICD-10-CM

## 2023-04-17 DIAGNOSIS — E039 Hypothyroidism, unspecified: Secondary | ICD-10-CM | POA: Diagnosis not present

## 2023-04-17 DIAGNOSIS — Z853 Personal history of malignant neoplasm of breast: Secondary | ICD-10-CM | POA: Diagnosis not present

## 2023-04-17 DIAGNOSIS — Z139 Encounter for screening, unspecified: Secondary | ICD-10-CM | POA: Diagnosis not present

## 2023-04-17 DIAGNOSIS — E785 Hyperlipidemia, unspecified: Secondary | ICD-10-CM | POA: Diagnosis not present

## 2023-04-17 DIAGNOSIS — I358 Other nonrheumatic aortic valve disorders: Secondary | ICD-10-CM | POA: Diagnosis not present

## 2023-06-01 ENCOUNTER — Ambulatory Visit
Admission: RE | Admit: 2023-06-01 | Discharge: 2023-06-01 | Disposition: A | Discharge status: Home / Self Care | Payer: Medicare HMO | Source: Ambulatory Visit | Attending: Physician Assistant | Admitting: Physician Assistant

## 2023-06-01 DIAGNOSIS — Z1231 Encounter for screening mammogram for malignant neoplasm of breast: Secondary | ICD-10-CM | POA: Insufficient documentation

## 2023-08-18 DIAGNOSIS — H524 Presbyopia: Secondary | ICD-10-CM | POA: Diagnosis not present

## 2023-10-23 DIAGNOSIS — I358 Other nonrheumatic aortic valve disorders: Secondary | ICD-10-CM | POA: Diagnosis not present

## 2023-10-23 DIAGNOSIS — Z9181 History of falling: Secondary | ICD-10-CM | POA: Diagnosis not present

## 2023-10-23 DIAGNOSIS — Z1331 Encounter for screening for depression: Secondary | ICD-10-CM | POA: Diagnosis not present

## 2023-10-23 DIAGNOSIS — E785 Hyperlipidemia, unspecified: Secondary | ICD-10-CM | POA: Diagnosis not present

## 2023-10-23 DIAGNOSIS — E039 Hypothyroidism, unspecified: Secondary | ICD-10-CM | POA: Diagnosis not present

## 2023-10-24 ENCOUNTER — Other Ambulatory Visit: Payer: Self-pay | Admitting: Physician Assistant

## 2023-10-24 DIAGNOSIS — R011 Cardiac murmur, unspecified: Secondary | ICD-10-CM

## 2024-04-23 ENCOUNTER — Other Ambulatory Visit: Payer: Self-pay | Admitting: Physician Assistant

## 2024-04-23 DIAGNOSIS — Z1231 Encounter for screening mammogram for malignant neoplasm of breast: Secondary | ICD-10-CM

## 2024-04-24 DIAGNOSIS — Z139 Encounter for screening, unspecified: Secondary | ICD-10-CM | POA: Diagnosis not present

## 2024-04-24 DIAGNOSIS — I358 Other nonrheumatic aortic valve disorders: Secondary | ICD-10-CM | POA: Diagnosis not present

## 2024-04-24 DIAGNOSIS — Z1211 Encounter for screening for malignant neoplasm of colon: Secondary | ICD-10-CM | POA: Diagnosis not present

## 2024-04-24 DIAGNOSIS — E039 Hypothyroidism, unspecified: Secondary | ICD-10-CM | POA: Diagnosis not present

## 2024-04-24 DIAGNOSIS — E785 Hyperlipidemia, unspecified: Secondary | ICD-10-CM | POA: Diagnosis not present

## 2024-04-29 DIAGNOSIS — Z1211 Encounter for screening for malignant neoplasm of colon: Secondary | ICD-10-CM | POA: Diagnosis not present

## 2024-04-29 DIAGNOSIS — Z1212 Encounter for screening for malignant neoplasm of rectum: Secondary | ICD-10-CM | POA: Diagnosis not present

## 2024-05-02 LAB — COLOGUARD: COLOGUARD: NEGATIVE

## 2024-06-03 ENCOUNTER — Ambulatory Visit
Admission: RE | Admit: 2024-06-03 | Discharge: 2024-06-03 | Disposition: A | Discharge status: Home / Self Care | Source: Ambulatory Visit | Attending: Physician Assistant | Admitting: Physician Assistant

## 2024-06-03 DIAGNOSIS — Z1231 Encounter for screening mammogram for malignant neoplasm of breast: Secondary | ICD-10-CM | POA: Insufficient documentation

## 2024-06-10 DIAGNOSIS — E039 Hypothyroidism, unspecified: Secondary | ICD-10-CM | POA: Diagnosis not present

## 2024-06-11 DIAGNOSIS — I358 Other nonrheumatic aortic valve disorders: Secondary | ICD-10-CM | POA: Diagnosis not present

## 2024-08-21 DIAGNOSIS — H353131 Nonexudative age-related macular degeneration, bilateral, early dry stage: Secondary | ICD-10-CM | POA: Diagnosis not present

## 2024-08-21 DIAGNOSIS — H26491 Other secondary cataract, right eye: Secondary | ICD-10-CM | POA: Diagnosis not present

## 2024-08-21 DIAGNOSIS — M3501 Sicca syndrome with keratoconjunctivitis: Secondary | ICD-10-CM | POA: Diagnosis not present

## 2024-08-21 DIAGNOSIS — H43813 Vitreous degeneration, bilateral: Secondary | ICD-10-CM | POA: Diagnosis not present

## 2024-10-29 DIAGNOSIS — L820 Inflamed seborrheic keratosis: Secondary | ICD-10-CM | POA: Diagnosis not present

## 2024-10-29 DIAGNOSIS — I340 Nonrheumatic mitral (valve) insufficiency: Secondary | ICD-10-CM | POA: Diagnosis not present

## 2024-10-29 DIAGNOSIS — E039 Hypothyroidism, unspecified: Secondary | ICD-10-CM | POA: Diagnosis not present

## 2024-10-29 DIAGNOSIS — E785 Hyperlipidemia, unspecified: Secondary | ICD-10-CM | POA: Diagnosis not present
